# Patient Record
Sex: Male | Born: 1955 | Race: White | Hispanic: No | Marital: Married | Smoking: Former smoker
Health system: Southern US, Community
[De-identification: ages and names within clinical notes are randomized; demographics above are authoritative.]

## PROBLEM LIST (undated history)

## (undated) DIAGNOSIS — R3912 Poor urinary stream: Secondary | ICD-10-CM

## (undated) DIAGNOSIS — K603 Anal fistula, unspecified: Secondary | ICD-10-CM

## (undated) DIAGNOSIS — G4730 Sleep apnea, unspecified: Secondary | ICD-10-CM

## (undated) DIAGNOSIS — M109 Gout, unspecified: Secondary | ICD-10-CM

## (undated) DIAGNOSIS — E785 Hyperlipidemia, unspecified: Secondary | ICD-10-CM

## (undated) DIAGNOSIS — K219 Gastro-esophageal reflux disease without esophagitis: Secondary | ICD-10-CM

## (undated) DIAGNOSIS — M1990 Unspecified osteoarthritis, unspecified site: Secondary | ICD-10-CM

## (undated) DIAGNOSIS — Z973 Presence of spectacles and contact lenses: Secondary | ICD-10-CM

## (undated) DIAGNOSIS — N401 Enlarged prostate with lower urinary tract symptoms: Secondary | ICD-10-CM

## (undated) DIAGNOSIS — R972 Elevated prostate specific antigen [PSA]: Secondary | ICD-10-CM

## (undated) DIAGNOSIS — Z9189 Other specified personal risk factors, not elsewhere classified: Secondary | ICD-10-CM

## (undated) HISTORY — PX: PROSTATE BIOPSY: SHX241

---

## 1998-04-13 ENCOUNTER — Ambulatory Visit (HOSPITAL_COMMUNITY): Admission: RE | Admit: 1998-04-13 | Discharge: 1998-04-13 | Payer: Self-pay | Admitting: Orthopedic Surgery

## 1998-05-07 ENCOUNTER — Emergency Department (HOSPITAL_COMMUNITY): Admission: EM | Admit: 1998-05-07 | Discharge: 1998-05-07 | Payer: Self-pay | Admitting: Emergency Medicine

## 2002-02-22 ENCOUNTER — Emergency Department (HOSPITAL_COMMUNITY): Admission: EM | Admit: 2002-02-22 | Discharge: 2002-02-22 | Payer: Self-pay | Admitting: Emergency Medicine

## 2003-08-08 HISTORY — PX: NASAL SINUS SURGERY: SHX719

## 2013-08-27 ENCOUNTER — Ambulatory Visit (INDEPENDENT_AMBULATORY_CARE_PROVIDER_SITE_OTHER): Payer: BC Managed Care – PPO | Admitting: General Surgery

## 2013-08-27 ENCOUNTER — Encounter (INDEPENDENT_AMBULATORY_CARE_PROVIDER_SITE_OTHER): Payer: Self-pay | Admitting: General Surgery

## 2013-08-27 ENCOUNTER — Encounter (INDEPENDENT_AMBULATORY_CARE_PROVIDER_SITE_OTHER): Payer: Self-pay

## 2013-08-27 VITALS — BP 136/90 | HR 75 | Temp 98.0°F | Resp 18 | Ht 73.0 in | Wt 250.0 lb

## 2013-08-27 DIAGNOSIS — K612 Anorectal abscess: Secondary | ICD-10-CM

## 2013-08-27 DIAGNOSIS — K610 Anal abscess: Secondary | ICD-10-CM

## 2013-08-27 MED ORDER — CIPROFLOXACIN HCL 500 MG PO TABS: 500.0000 mg | ORAL_TABLET | Freq: Two times a day (BID) | ORAL | Status: AC

## 2013-08-27 MED ORDER — HYDROCODONE-ACETAMINOPHEN 5-325 MG PO TABS: 1.0000 | ORAL_TABLET | ORAL | Status: DC | PRN

## 2013-08-27 NOTE — Patient Instructions (Signed)
Perianal Abscess  An abscess is an infected area that contains a collection of pus and debris. A perianal abscess is one that occurs in the perineal area, which is the area between the anus and the scrotum in males and between the anus and the vagina in females. Perianal abscesses can vary in size. Without treatment, a perianal abscess can become larger and cause other problems.  CAUSES   Glands in the perineal area can become plugged up with debris. When this happens, an abscess may form.   SIGNS AND SYMPTOMS   The most common symptoms of a perianal abscess are:  · Swelling and redness in the area of the abscess. The redness may go beyond the abscess and appear as a red streak on the skin.  · Pain in the area of the abscess.  Other possible symptoms include:   · A visible lump or a lump that can be felt when touching the area and is usually painful.  · Bleeding or pus-like discharge from the area.  · Fever.  · General weakness.  DIAGNOSIS   Your health care provider will take a medical history and examine the area. This may involve examining the rectal area with a gloved hand (digital rectal exam). For women, it may require a careful vaginal exam. Sometimes, the health care provider needs to look into the rectum using a probe or scope.  TREATMENT   Treatment often requires making a cut (incision) in the abscess to drain the pus. This can sometimes be done in your health care provider's office or an emergency department after giving you medicine to numb the area (local anesthetic). For larger or deeper abscesses, surgery may be required to drain the abscess. Antibiotic medicines are sometimes given if there is infection of the surrounding tissue (cellulitis). In some cases, gauze is packed into the abscess to continue draining the area. Frequent sitz baths may be recommended to help the wound heal and to reduce the chance of the abscess coming back.  HOME CARE INSTRUCTIONS   · Only take over-the-counter or  prescription medicines for pain, fever, or discomfort as directed by your health care provider.  · Take antibiotic medicine as directed. Make sure you finish it even if you start to feel better.  · If gauze is used in the abscess, follow your health care provider's instructions for removing or changing the gauze. It can usually be removed in 2 3 days.  · If one or more drains have been placed in the abscess cavity, be careful not to pull at them. Your health care provider will tell you how long they need to remain in place.  · Take warm sitz baths 3 4 times a day and after bowel movements. This will help reduce pain and swelling.  · Keep the skin around the abscess clean and dry. Avoid cleaning the area too much.  · Avoid scratching the abscess area.  · Avoid using colored or perfumed toilet papers.  SEEK MEDICAL CARE IF:   · You have trouble having a bowel movement or passing urine.  · Your pain or swelling in the affected area does not seem to be improving.  · The gauze packing or the drains come out before the planned time.  SEEK IMMEDIATE MEDICAL CARE IF:   · You have problems moving or using your legs.  · You have severe or increasing pain.  · Your swelling in the affected area suddenly gets worse.  · You have a large   increase in bleeding or passing of pus.  · You have chills or a fever.  MAKE SURE YOU:   · Understand these instructions.  · Will watch your condition.  · Will get help right away if you are not doing well or get worse.  Document Released: 08/30/2006 Document Revised: 05/14/2013 Document Reviewed: 03/05/2013  Exit[employer]® Patient Information ©2014 [employer].

## 2013-08-27 NOTE — Progress Notes (Signed)
Chief complaint: Perirectal lump and pain  History: Patient is a pleasant 58 year old male referred by Dr. Clelia CroftShaw. He states that about 2 weeks ago he began to gradually develop some tenderness and swelling to the left of his anus. This gradually worsened and this past weekend, about 5 days ago, it was particularly bad. He states that over the past 5 days it has been no better or no worse. No fever or chills. No drainage. No previous history of any perirectal disease or inflammatory bowel disease. He saw Dr. Clelia CroftShaw today and was referred for a possible perirectal abscess.  No past medical history on file. Past Surgical History  Procedure Laterality Date  . Nasal sinus surgery  2009   Current Outpatient Prescriptions  Medication Sig Dispense Refill  . atorvastatin (LIPITOR) 20 MG tablet Take 20 mg by mouth daily.       No current facility-administered medications for this visit.   Exam: BP 136/90  Pulse 75  Temp(Src) 98 F (36.7 C)  Resp 18  Ht 6\' 1"  (1.854 m)  Wt 250 lb (113.399 kg)  BMI 32.99 kg/m2 General: Overweight but healthy-appearing Caucasian male in no distress Current exam today was limited and a rectal exam. About 4 cm from the anal verge in the left lateral perirectal space is a approximately a 3 cm area of swelling and induration but I do not feel any definite fluctuance and there is no overlying erythema. I cannot detect any obvious communication back toward the anus.  After explaining the procedure I anesthetized the overlying skin and surrounding soft tissue with local anesthesia. Using an 18-gauge needle I was able to aspirate definite pus. I then incised into a moderate-sized somewhat deep perianal abscess which was opened and packed with iodoform gauze.  He is given a prescription for Augmentin for 1 week. Also Vicodin for pain. He given literature regarding rectal abscesses and fistulas and I asked them to call if he is not feeling steadily better. Return in 3 weeks.

## 2013-09-18 ENCOUNTER — Ambulatory Visit (INDEPENDENT_AMBULATORY_CARE_PROVIDER_SITE_OTHER): Payer: BC Managed Care – PPO | Admitting: General Surgery

## 2013-09-18 ENCOUNTER — Encounter (INDEPENDENT_AMBULATORY_CARE_PROVIDER_SITE_OTHER): Payer: Self-pay | Admitting: General Surgery

## 2013-09-18 VITALS — BP 130/86 | HR 72 | Temp 97.5°F | Resp 15 | Ht 73.0 in | Wt 253.6 lb

## 2013-09-18 DIAGNOSIS — Z09 Encounter for follow-up examination after completed treatment for conditions other than malignant neoplasm: Secondary | ICD-10-CM

## 2013-09-18 NOTE — Progress Notes (Signed)
History: Patient returns 3 weeks following I&D of the perianal abscess. This was in the left lateral space. He reports he has been feeling steadily better ever since and has just mild tenderness and no drainage  Exam: BP 130/86  Pulse 72  Temp(Src) 97.5 F (36.4 C) (Oral)  Resp 15  Ht 6\' 1"  (1.854 m)  Wt 253 lb 9.6 oz (115.032 kg)  BMI 33.47 kg/m2 The incision is completely healed. No skin openings. There is a small area of induration just beneath the I&D site but no inflammation or fluctuance.  Assessment plan: Appears to be healing well following I&D of perianal abscess. I do not see evidence of fistula formation at this time. I asked him to call as needed should he develop any worsening symptoms.

## 2013-09-19 ENCOUNTER — Telehealth (INDEPENDENT_AMBULATORY_CARE_PROVIDER_SITE_OTHER): Payer: Self-pay | Admitting: General Surgery

## 2013-09-19 NOTE — Telephone Encounter (Signed)
Patient called in explaining that he believes his wound has opened back up.  After speaking with Dr. Derrell LollingIngram, he wanted the patient to take warm bath soaks TID, wear a pad, and get an appointment with Dr. Johna SheriffHoxworth in 2 weeks to reck the wound.  I explained this information and informed him that Dr. Jamse MeadHoxworth's nurse will call him with an appointment.  Informed him to call us if things seem to progress to something worse.  He verbalized understanding.

## 2013-10-20 ENCOUNTER — Encounter (INDEPENDENT_AMBULATORY_CARE_PROVIDER_SITE_OTHER): Payer: Self-pay

## 2013-10-20 ENCOUNTER — Encounter (INDEPENDENT_AMBULATORY_CARE_PROVIDER_SITE_OTHER): Payer: Self-pay | Admitting: General Surgery

## 2013-10-20 ENCOUNTER — Ambulatory Visit (INDEPENDENT_AMBULATORY_CARE_PROVIDER_SITE_OTHER): Payer: BC Managed Care – PPO | Admitting: General Surgery

## 2013-10-20 VITALS — BP 124/82 | HR 76 | Temp 99.1°F | Resp 16 | Ht 73.0 in | Wt 257.0 lb

## 2013-10-20 DIAGNOSIS — K610 Anal abscess: Secondary | ICD-10-CM

## 2013-10-20 DIAGNOSIS — K612 Anorectal abscess: Secondary | ICD-10-CM

## 2013-10-20 MED ORDER — HYDROCODONE-ACETAMINOPHEN 5-325 MG PO TABS: 1.0000 | ORAL_TABLET | ORAL | Status: DC | PRN

## 2013-10-20 MED ORDER — CIPROFLOXACIN HCL 500 MG PO TABS: 500.0000 mg | ORAL_TABLET | Freq: Two times a day (BID) | ORAL | Status: AC

## 2013-10-20 NOTE — Patient Instructions (Signed)
Remove packing tomorrow. Soak in a warm tub or shower twice daily and cover with dry gauze. Call if symptoms are not steadily improving.

## 2013-10-20 NOTE — Progress Notes (Signed)
Chief complaint: Recurrent rectal abscess  History: Patient returned to the office. I drained a perirectal or deep perianal abscess at the end of January. He had had no previous history. I saw him back in early February feeling well and I did not see any evidence of fistula formation at that time. He continued to do well until for 5 days ago and has developed recurrent pain and swelling at the same site and now some purulent drainage.  Exam: BP 124/82  Pulse 76  Temp(Src) 99.1 F (37.3 C) (Temporal)  Resp 16  Ht 6\' 1"  (1.854 m)  Wt 257 lb (116.574 kg)  BMI 33.91 kg/m2 In the left perianal space at the site of the previous incision as a pinpoint area of purulent drainage and some mild induration and erythema.  Assessment and plan: Recurrent perianal abscess. After explaining the procedure and local anesthesia I excised the previous scar and opened up a recurrent abscess cavity is about a centimeter deep to the skin and probably a centimeter in diameter. This was packed with iodoform gauze. I cannot feel any definite fistula tract up toward the anus. An unrecognized fistula and I discussed this with him. He was given a prescription for Cipro and Norco and wound care instructions. He will return in 3 weeks or call sooner if he is not feeling steadily better

## 2013-11-06 ENCOUNTER — Encounter (INDEPENDENT_AMBULATORY_CARE_PROVIDER_SITE_OTHER): Payer: Self-pay | Admitting: Surgery

## 2013-11-06 ENCOUNTER — Encounter (INDEPENDENT_AMBULATORY_CARE_PROVIDER_SITE_OTHER): Payer: Self-pay

## 2013-11-06 ENCOUNTER — Ambulatory Visit (INDEPENDENT_AMBULATORY_CARE_PROVIDER_SITE_OTHER): Payer: BC Managed Care – PPO | Admitting: Surgery

## 2013-11-06 VITALS — BP 132/80 | HR 78 | Temp 97.6°F | Resp 16 | Ht 73.0 in | Wt 251.4 lb

## 2013-11-06 DIAGNOSIS — K612 Anorectal abscess: Secondary | ICD-10-CM

## 2013-11-06 DIAGNOSIS — K610 Anal abscess: Secondary | ICD-10-CM

## 2013-11-06 MED ORDER — OXYCODONE-ACETAMINOPHEN 5-325 MG PO TABS: 1.0000 | ORAL_TABLET | ORAL | Status: DC | PRN

## 2013-11-06 MED ORDER — LEVOFLOXACIN 500 MG PO TABS: 500.0000 mg | ORAL_TABLET | Freq: Every day | ORAL | Status: AC

## 2013-11-06 NOTE — Progress Notes (Signed)
Subjective:     Patient ID: Michael Randall, male   DOB: [birth year], 58 y.o.   MRN: [id number]  HPI This is a patient of Dr. Johna SheriffHoxworth.  He has had several incision and drainage procedures of a perianal abscess. This has been concerning for a possible fistula. His last procedure was on March 16. He started having pain and drainage yesterday.  Review of Systems     Objective:   Physical Exam On exam, there is a small open area draining serous fluid. I prepped it with Betadine, anesthetized with lidocaine, and opened up the previous I and D. Site. There was no purulence.    Assessment:     Recurrent perianal abscess     Plan:     Again, this is worrisome for a developing fistula. I will place him on Levaquin and for Percocet.  He will followup with Dr. Johna SheriffHoxworth next week

## 2013-11-12 ENCOUNTER — Encounter (INDEPENDENT_AMBULATORY_CARE_PROVIDER_SITE_OTHER): Payer: Self-pay | Admitting: General Surgery

## 2013-11-12 ENCOUNTER — Ambulatory Visit (INDEPENDENT_AMBULATORY_CARE_PROVIDER_SITE_OTHER): Payer: BC Managed Care – PPO | Admitting: General Surgery

## 2013-11-12 VITALS — BP 130/80 | HR 80 | Temp 97.0°F | Resp 16 | Wt 252.6 lb

## 2013-11-12 DIAGNOSIS — K603 Anal fistula, unspecified: Secondary | ICD-10-CM

## 2013-11-12 NOTE — Progress Notes (Signed)
Chief complaint: Followup perianal abscess  History: Patient returns to the office for followup. He has had 2 previous pararectal abscesses drained which appeared to heal completely. He however presented with a recurrent abscess in the office recently with Dr. Rayburn MaBlackmon open and drained a small amount of material from what appeared to be a fistula. He still has some discomfort but is gradually feeling better.  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 There is no active infection currently. We discussed the nature of the problem and the potential surgical options. I will ask to Dr. Maisie Fushomas in our office to evaluate him from a colorectal perspective.

## 2013-11-12 NOTE — Patient Instructions (Signed)
Anal Fistula °An anal fistula is an abnormal tunnel that develops between the bowel and skin near the outside of the anus, where feces comes out. The anus has a number of tiny glands that make lubricating fluid. Sometimes these glands can become plugged and infected. This may lead to the development of a fluid-filled pocket (abscess). An anal fistula often develops after this infection or abscess. It is nearly always caused by a past or current anal abscess.  °CAUSES  °Though an anal fistula is almost always caused by a past or current anal abscess, other causes can include: °· A complication of surgery. °· Trauma to the rectal area. °· Radiation to the area. °· Other medical conditions or diseases, such as:   °· Chronic inflammatory bowel disease, such as Crohn disease or ulcerative colitis.   °· Colon or rectal cancer.   °· Diverticular disease, such as diverticulitis.   °· A sexually transmitted disease, such as gonorrhea, chlamydia, or syphilis. °· An HIV infection or AIDS.   °SYMPTOMS  °· Throbbing or constant pain that may be worse when sitting.   °· Swelling or irritation around the anus.   °· Drainage of pus or blood from an opening near the anus.   °· Pain with bowel movements. °· Fever or chills. °DIAGNOSIS  °Your caregiver will examine the area to find the openings of the anal fistula and the fistula tract. The external opening of the anal fistula may be seen during a physical examination. Other examinations that may be performed include:  °· Examination of the rectal area with a gloved hand (digital rectal exam).   °· Examination with a probe or scope to help locate the internal opening of the fistula.   °· Injection of a dye into the fistula opening. X-rays can be taken to find the exact location and path of the fistula.   °· An MRI or ultrasound of the anal area.   °Other tests may be performed to find the cause of the anal fistula.    °TREATMENT  °The most common treatment for an anal fistula is  surgery. There are different surgery options depending on where your fistula is located and how complex the fistula is. Surgical options include: °· A fistulotomy. This surgery involves opening up the whole fistula and draining the contents inside to promote healing. °· Seton placement. A silk string (seton) is placed into the fistula during a fistulotomy to drain any infection to promote healing. °· Advancement flap procedure. Tissue is removed from your rectum or the skin around the anus and is attached to the opening of the fistula. °· Bioprosthetic plug. A cone-shaped plug is made from your tissue and is used to block the opening of the fistula. °Some anal fistulas do not require surgery. A fibrin glue is a non-surgical option that involves injecting the glue to seal the fistula. You also may be prescribed an antibiotic medicine to treat an infection.  °HOME CARE INSTRUCTIONS  °· Take your antibiotics as directed. Finish them even if you start to feel better. °· Only take over-the-counter or prescription medicines as directed by your caregiver. Use a stool softener or laxative, if recommended.   °· Eat a high-fiber diet to help avoid constipation or as directed by your caregiver. °· Drink enough water to keep your urine clear or pale yellow.   °· A warm sitz bath may be soothing and help with healing. You may take warm sitz baths for 15 20 minutes, 3 4 times a day to ease pain and discomfort.   °· Follow excellent hygiene to keep the   anal area as clean and dry as possible. Use wet toilet paper or moist towelettes after each bowel movement.   °SEEK MEDICAL CARE IF: °You have increased pain not controlled with medicines.  °SEEK IMMEDIATE MEDICAL CARE IF: °· You have severe, intolerable pain. °· You have new swelling, redness, or discharge around the anal area. °· You have tenderness or warmth around the anal area. °· You have chills or diarrhea. °· You have severe problems urinating or having a bowel movement.    °· You have a fever or persistent symptoms for more than 2 3 days.   °· You have a fever and your symptoms suddenly get worse.   °MAKE SURE YOU:  °· Understand these instructions. °· Will watch your condition. °· Will get help right away if you are not doing well or get worse. °Document Released: 07/06/2008 Document Revised: 07/10/2012 Document Reviewed: 05/29/2011 °[employer]® Patient Information ©2014 [employer]. ° °

## 2013-11-14 ENCOUNTER — Encounter (INDEPENDENT_AMBULATORY_CARE_PROVIDER_SITE_OTHER): Payer: BC Managed Care – PPO | Admitting: General Surgery

## 2013-11-27 ENCOUNTER — Ambulatory Visit (INDEPENDENT_AMBULATORY_CARE_PROVIDER_SITE_OTHER): Payer: BC Managed Care – PPO | Admitting: General Surgery

## 2013-12-02 ENCOUNTER — Ambulatory Visit (INDEPENDENT_AMBULATORY_CARE_PROVIDER_SITE_OTHER): Payer: BC Managed Care – PPO | Admitting: General Surgery

## 2013-12-02 ENCOUNTER — Encounter (INDEPENDENT_AMBULATORY_CARE_PROVIDER_SITE_OTHER): Payer: Self-pay | Admitting: General Surgery

## 2013-12-02 VITALS — BP 130/88 | HR 78 | Temp 97.2°F | Resp 14 | Wt 254.0 lb

## 2013-12-02 DIAGNOSIS — K603 Anal fistula: Secondary | ICD-10-CM

## 2013-12-02 NOTE — Progress Notes (Signed)
Chief Complaint  Patient presents with  . New Evaluation    peri-anal fistula    HISTORY: Michael Randall is a 58 y.o. male who presents to the office with recurrent perianal abscesses.  This had been occurring since Jan. His bowel habits are regular and his bowel movements are soft.  He denies constipation or diarrhea.  His fiber intake is dietary.  His last colonoscopy was about 4 yrs ago and was normal per pt.       History reviewed. No pertinent past medical history.    Past Surgical History  Procedure Laterality Date  . Nasal sinus surgery  2009        Current Outpatient Prescriptions  Medication Sig Dispense Refill  . atorvastatin (LIPITOR) 20 MG tablet Take 20 mg by mouth daily.      Marland Kitchen. oxyCODONE-acetaminophen (PERCOCET/ROXICET) 5-325 MG per tablet       . sertraline (ZOLOFT) 25 MG tablet        No current facility-administered medications for this visit.      Allergies  Allergen Reactions  . Augmentin [Amoxicillin-Pot Clavulanate] Hives      Family History  Problem Relation Age of Onset  . Cancer Mother      breast  . Heart disease Father   . Cancer Sister     breast  . Cancer Maternal Grandmother     breast ca    History   Social History  . Marital Status: Married    Spouse Name: N/A    Number of Children: N/A  . Years of Education: N/A   Social History Main Topics  . Smoking status: Former Smoker    Quit date: 09/18/2006  . Smokeless tobacco: Never Used     Comment: quit cigars 7 yrs ago  . Alcohol Use: Yes     Comment: occasionall  . Drug Use: No  . Sexual Activity: None   Other Topics Concern  . None   Social History Narrative  . None      REVIEW OF SYSTEMS - PERTINENT POSITIVES ONLY: Review of Systems - General ROS: negative for - chills, fever or weight loss Hematological and Lymphatic ROS: negative for - bleeding problems, blood clots or bruising Respiratory ROS: no cough, shortness of breath, or wheezing Cardiovascular ROS: no  chest pain or dyspnea on exertion Gastrointestinal ROS: negative Genito-Urinary ROS: no dysuria, trouble voiding, or hematuria  EXAM: Filed Vitals:   12/02/13 1155  BP: 130/88  Pulse: 78  Temp: 97.2 F (36.2 C)  Resp: 14    General appearance: alert and cooperative Resp: clear to auscultation bilaterally CV: RRR GI: soft, nontender  Anal Exam Findings: moderate sphincter tone, long sphincter, L lat ext opening with min drainage, no fluctuant masses    ASSESSMENT AND PLAN: Michael Randall is a 58 y.o. 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  I have recommended anal exam under anesthesia. If we do find a fistula we will do a fistulotomy or a seton depending on the depth of the fistula. We discussed the risk and benefits of both of these. We discussed that there is a small risk of incontinence with a fistulotomy. We discussed that if a seton is placed he would need additional surgery to correct the fistula in the future. He is agreeable to this and we will schedule this at his convenience.    Vanita PandaAlicia C Brenlynn Fake, MD Colon and Rectal Surgery / General Surgery [facility]  Visit Diagnoses: No diagnosis found.  Primary Care Physician: Lupita RaiderSHAW,KIMBERLEE, MD

## 2013-12-02 NOTE — Patient Instructions (Signed)

## 2013-12-12 ENCOUNTER — Encounter (INDEPENDENT_AMBULATORY_CARE_PROVIDER_SITE_OTHER): Payer: BC Managed Care – PPO | Admitting: General Surgery

## 2013-12-15 ENCOUNTER — Encounter (HOSPITAL_BASED_OUTPATIENT_CLINIC_OR_DEPARTMENT_OTHER): Payer: Self-pay | Admitting: *Deleted

## 2013-12-16 ENCOUNTER — Encounter (HOSPITAL_BASED_OUTPATIENT_CLINIC_OR_DEPARTMENT_OTHER): Payer: Self-pay | Admitting: *Deleted

## 2013-12-16 NOTE — Progress Notes (Signed)
NPO AFTER MN.  ARRIVE AT 1015.  NEEDS HG. 

## 2013-12-16 NOTE — Progress Notes (Signed)
12/16/13 1325  OBSTRUCTIVE SLEEP APNEA  Have you ever been diagnosed with sleep apnea through a sleep study? No  Do you snore loudly (loud enough to be heard through closed doors)?  1  Do you often feel tired, fatigued, or sleepy during the daytime? 0  Has anyone observed you stop breathing during your sleep? 1  Do you have, or are you being treated for high blood pressure? 0  BMI more than 35 kg/m2? 0  Age over 56 years old? 1  Neck circumference greater than 40 cm/16 inches? 1  Gender: 1  Obstructive Sleep Apnea Score 5  Score 4 or greater  Results sent to PCP

## 2013-12-19 ENCOUNTER — Ambulatory Visit (HOSPITAL_BASED_OUTPATIENT_CLINIC_OR_DEPARTMENT_OTHER)
Admission: RE | Admit: 2013-12-19 | Discharge: 2013-12-19 | Disposition: A | Discharge status: Home / Self Care | Payer: BC Managed Care – PPO | Source: Ambulatory Visit | Attending: General Surgery | Admitting: General Surgery

## 2013-12-19 ENCOUNTER — Encounter (HOSPITAL_BASED_OUTPATIENT_CLINIC_OR_DEPARTMENT_OTHER)
Admission: RE | Disposition: A | Discharge status: Home / Self Care | Payer: Self-pay | Source: Ambulatory Visit | Attending: General Surgery

## 2013-12-19 ENCOUNTER — Encounter (HOSPITAL_BASED_OUTPATIENT_CLINIC_OR_DEPARTMENT_OTHER): Payer: Self-pay

## 2013-12-19 ENCOUNTER — Ambulatory Visit (HOSPITAL_BASED_OUTPATIENT_CLINIC_OR_DEPARTMENT_OTHER): Payer: BC Managed Care – PPO | Admitting: Anesthesiology

## 2013-12-19 ENCOUNTER — Encounter (HOSPITAL_BASED_OUTPATIENT_CLINIC_OR_DEPARTMENT_OTHER): Payer: BC Managed Care – PPO | Admitting: Anesthesiology

## 2013-12-19 DIAGNOSIS — K603 Anal fistula, unspecified: Secondary | ICD-10-CM | POA: Insufficient documentation

## 2013-12-19 DIAGNOSIS — K219 Gastro-esophageal reflux disease without esophagitis: Secondary | ICD-10-CM | POA: Insufficient documentation

## 2013-12-19 DIAGNOSIS — Z87891 Personal history of nicotine dependence: Secondary | ICD-10-CM | POA: Insufficient documentation

## 2013-12-19 HISTORY — PX: PLACEMENT OF SETON: SHX6029

## 2013-12-19 HISTORY — DX: Presence of spectacles and contact lenses: Z97.3

## 2013-12-19 HISTORY — PX: EVALUATION UNDER ANESTHESIA WITH ANAL FISTULECTOMY: SHX5621

## 2013-12-19 HISTORY — DX: Gastro-esophageal reflux disease without esophagitis: K21.9

## 2013-12-19 HISTORY — DX: Hyperlipidemia, unspecified: E78.5

## 2013-12-19 HISTORY — DX: Other specified personal risk factors, not elsewhere classified: Z91.89

## 2013-12-19 HISTORY — DX: Unspecified osteoarthritis, unspecified site: M19.90

## 2013-12-19 HISTORY — DX: Anal fistula: K60.3

## 2013-12-19 HISTORY — DX: Anal fistula, unspecified: K60.30

## 2013-12-19 LAB — POCT HEMOGLOBIN-HEMACUE: Hemoglobin: 15.4 g/dL (ref 13.0–17.0)

## 2013-12-19 SURGERY — EXAM UNDER ANESTHESIA WITH ANAL FISTULECTOMY
Anesthesia: Monitor Anesthesia Care | Site: Rectum

## 2013-12-19 MED ORDER — HYDROGEN PEROXIDE 3 % EX SOLN
CUTANEOUS | Status: DC | PRN
  Administered 2013-12-19: 1

## 2013-12-19 MED ORDER — DEXAMETHASONE SODIUM PHOSPHATE 4 MG/ML IJ SOLN
INTRAMUSCULAR | Status: DC | PRN
  Administered 2013-12-19: 10 mg via INTRAVENOUS

## 2013-12-19 MED ORDER — SODIUM CHLORIDE 0.9 % IJ SOLN
3.0000 mL | INTRAMUSCULAR | Status: DC | PRN
  Filled 2013-12-19: qty 3

## 2013-12-19 MED ORDER — OXYCODONE HCL 5 MG PO TABS
5.0000 mg | ORAL_TABLET | ORAL | Status: DC | PRN
  Filled 2013-12-19: qty 2

## 2013-12-19 MED ORDER — SODIUM CHLORIDE 0.9 % IJ SOLN
3.0000 mL | Freq: Two times a day (BID) | INTRAMUSCULAR | Status: DC
  Filled 2013-12-19: qty 3

## 2013-12-19 MED ORDER — FENTANYL CITRATE 0.05 MG/ML IJ SOLN
INTRAMUSCULAR | Status: AC
  Filled 2013-12-19: qty 4

## 2013-12-19 MED ORDER — PROPOFOL 10 MG/ML IV BOLUS
INTRAVENOUS | Status: DC | PRN
  Administered 2013-12-19: 30 mg via INTRAVENOUS
  Administered 2013-12-19: 50 mg via INTRAVENOUS

## 2013-12-19 MED ORDER — KETAMINE HCL 10 MG/ML IJ SOLN
INTRAMUSCULAR | Status: AC
  Filled 2013-12-19: qty 1

## 2013-12-19 MED ORDER — LACTATED RINGERS IV SOLN
INTRAVENOUS | Status: DC
  Administered 2013-12-19: 10:00:00 via INTRAVENOUS
  Filled 2013-12-19: qty 1000

## 2013-12-19 MED ORDER — FENTANYL CITRATE 0.05 MG/ML IJ SOLN
INTRAMUSCULAR | Status: DC | PRN
  Administered 2013-12-19 (×3): 50 ug via INTRAVENOUS

## 2013-12-19 MED ORDER — MIDAZOLAM HCL 5 MG/5ML IJ SOLN
INTRAMUSCULAR | Status: DC | PRN
  Administered 2013-12-19: 2 mg via INTRAVENOUS

## 2013-12-19 MED ORDER — LACTATED RINGERS IV SOLN
INTRAVENOUS | Status: DC
  Filled 2013-12-19: qty 1000

## 2013-12-19 MED ORDER — LIDOCAINE 5 % EX OINT
TOPICAL_OINTMENT | CUTANEOUS | Status: DC | PRN
  Administered 2013-12-19: 1

## 2013-12-19 MED ORDER — ACETAMINOPHEN 650 MG RE SUPP
650.0000 mg | RECTAL | Status: DC | PRN
  Filled 2013-12-19: qty 1

## 2013-12-19 MED ORDER — SODIUM CHLORIDE 0.9 % IV SOLN
250.0000 mL | INTRAVENOUS | Status: DC | PRN
  Filled 2013-12-19: qty 250

## 2013-12-19 MED ORDER — ONDANSETRON HCL 4 MG/2ML IJ SOLN
INTRAMUSCULAR | Status: DC | PRN
  Administered 2013-12-19: 4 mg via INTRAVENOUS

## 2013-12-19 MED ORDER — FENTANYL CITRATE 0.05 MG/ML IJ SOLN
25.0000 ug | INTRAMUSCULAR | Status: DC | PRN
  Filled 2013-12-19: qty 1

## 2013-12-19 MED ORDER — PROPOFOL 10 MG/ML IV EMUL
INTRAVENOUS | Status: DC | PRN
  Administered 2013-12-19: 50 ug/kg/min via INTRAVENOUS

## 2013-12-19 MED ORDER — MIDAZOLAM HCL 2 MG/2ML IJ SOLN
INTRAMUSCULAR | Status: AC
  Filled 2013-12-19: qty 2

## 2013-12-19 MED ORDER — DOCUSATE SODIUM 100 MG PO CAPS: 100.0000 mg | ORAL_CAPSULE | Freq: Two times a day (BID) | ORAL | Status: DC

## 2013-12-19 MED ORDER — BUPIVACAINE-EPINEPHRINE 0.5% -1:200000 IJ SOLN
INTRAMUSCULAR | Status: DC | PRN
  Administered 2013-12-19: 30 mL

## 2013-12-19 MED ORDER — ACETAMINOPHEN 325 MG PO TABS
650.0000 mg | ORAL_TABLET | ORAL | Status: DC | PRN
  Filled 2013-12-19: qty 2

## 2013-12-19 MED ORDER — KETOROLAC TROMETHAMINE 30 MG/ML IJ SOLN
INTRAMUSCULAR | Status: DC | PRN
  Administered 2013-12-19: 30 mg via INTRAVENOUS

## 2013-12-19 MED ORDER — OXYCODONE HCL 5 MG PO TABS: 5.0000 mg | ORAL_TABLET | ORAL | Status: DC | PRN

## 2013-12-19 MED ORDER — KETAMINE HCL 10 MG/ML IJ SOLN
INTRAMUSCULAR | Status: DC | PRN
  Administered 2013-12-19: 50 mg via INTRAVENOUS

## 2013-12-19 MED ORDER — OXYCODONE HCL 5 MG PO TABS
ORAL_TABLET | ORAL | Status: AC
  Filled 2013-12-19: qty 1

## 2013-12-19 MED ORDER — OXYCODONE HCL 5 MG PO TABS
5.0000 mg | ORAL_TABLET | ORAL | Status: DC | PRN
  Administered 2013-12-19: 5 mg via ORAL
  Filled 2013-12-19: qty 1

## 2013-12-19 SURGICAL SUPPLY — 44 items
BANDAGE GAUZE ELAST BULKY 4 IN (GAUZE/BANDAGES/DRESSINGS) ×2 IMPLANT
BENZOIN TINCTURE PRP APPL 2/3 (GAUZE/BANDAGES/DRESSINGS) ×2 IMPLANT
BLADE HEX COATED 2.75 (ELECTRODE) ×2 IMPLANT
BLADE SURG 15 STRL LF DISP TIS (BLADE) ×1 IMPLANT
BLADE SURG 15 STRL SS (BLADE) ×1
BRIEF STRETCH FOR OB PAD LRG (UNDERPADS AND DIAPERS) ×2 IMPLANT
CANISTER SUCTION 2500CC (MISCELLANEOUS) ×2 IMPLANT
CLOTH BEACON ORANGE TIMEOUT ST (SAFETY) ×2 IMPLANT
COVER MAYO STAND STRL (DRAPES) ×2 IMPLANT
COVER TABLE BACK 60X90 (DRAPES) ×2 IMPLANT
DECANTER SPIKE VIAL GLASS SM (MISCELLANEOUS) ×2 IMPLANT
DRAPE LG THREE QUARTER DISP (DRAPES) ×4 IMPLANT
DRAPE PED LAPAROTOMY (DRAPES) ×2 IMPLANT
ELECT REM PT RETURN 9FT ADLT (ELECTROSURGICAL) ×2
ELECTRODE REM PT RTRN 9FT ADLT (ELECTROSURGICAL) ×1 IMPLANT
GLOVE BIO SURGEON STRL SZ 6.5 (GLOVE) ×6 IMPLANT
GLOVE INDICATOR 6.5 STRL GRN (GLOVE) ×2 IMPLANT
GLOVE INDICATOR 7.0 STRL GRN (GLOVE) ×4 IMPLANT
GOWN STRL REIN 3XL LVL4 (GOWN DISPOSABLE) ×2 IMPLANT
GOWN STRL REUS W/ TWL LRG LVL3 (GOWN DISPOSABLE) ×1 IMPLANT
GOWN STRL REUS W/TWL 2XL LVL3 (GOWN DISPOSABLE) ×2 IMPLANT
GOWN STRL REUS W/TWL LRG LVL3 (GOWN DISPOSABLE) ×1
HYDROGEN PEROXIDE 16OZ (MISCELLANEOUS) ×2 IMPLANT
LOOP VESSEL MAXI BLUE (MISCELLANEOUS) ×2 IMPLANT
NEEDLE HYPO 25X1 1.5 SAFETY (NEEDLE) ×2 IMPLANT
NS IRRIG 500ML POUR BTL (IV SOLUTION) ×2 IMPLANT
PACK BASIN DAY SURGERY FS (CUSTOM PROCEDURE TRAY) ×2 IMPLANT
PAD ABD 8X10 STRL (GAUZE/BANDAGES/DRESSINGS) IMPLANT
PENCIL BUTTON HOLSTER BLD 10FT (ELECTRODE) ×2 IMPLANT
SPONGE GAUZE 4X4 12PLY (GAUZE/BANDAGES/DRESSINGS) ×2 IMPLANT
SPONGE SURGIFOAM ABS GEL 12-7 (HEMOSTASIS) IMPLANT
SUT CHROMIC 2 0 SH (SUTURE) IMPLANT
SUT CHROMIC 3 0 SH 27 (SUTURE) ×2 IMPLANT
SUT ETHIBOND 0 (SUTURE) ×2 IMPLANT
SUT MON AB 3-0 SH 27 (SUTURE) ×1
SUT MON AB 3-0 SH27 (SUTURE) ×1 IMPLANT
SUT VIC AB 4-0 P-3 18XBRD (SUTURE) IMPLANT
SUT VIC AB 4-0 P3 18 (SUTURE)
SYR CONTROL 10ML LL (SYRINGE) ×4 IMPLANT
TAPE CLOTH SURG 4X10 WHT LF (GAUZE/BANDAGES/DRESSINGS) ×2 IMPLANT
TOWEL OR 17X24 6PK STRL BLUE (TOWEL DISPOSABLE) ×2 IMPLANT
TRAY DSU PREP LF (CUSTOM PROCEDURE TRAY) ×2 IMPLANT
TUBE CONNECTING 12X1/4 (SUCTIONS) ×2 IMPLANT
YANKAUER SUCT BULB TIP NO VENT (SUCTIONS) ×2 IMPLANT

## 2013-12-19 NOTE — Anesthesia Procedure Notes (Signed)
Procedure Name: MAC Date/Time: 12/19/2013 12:40 PM Performed by: Tyrone NineSAUVE, Aprel Egelhoff F Pre-anesthesia Checklist: Patient identified, Timeout performed, Emergency Drugs available, Suction available and Patient being monitored Patient Re-evaluated:Patient Re-evaluated prior to inductionOxygen Delivery Method: Simple face mask Preoxygenation: Pre-oxygenation with 100% oxygen Intubation Type: IV induction Placement Confirmation: positive ETCO2

## 2013-12-19 NOTE — Discharge Instructions (Addendum)
ANORECTAL SURGERY: POST OP INSTRUCTIONS °1. Take your usually prescribed home medications unless otherwise directed. °2. DIET: During the first few hours after surgery sip on some liquids until you are able to urinate.  It is normal to not urinate for several hours after this surgery.  If you feel uncomfortable, please contact the office for instructions.  After you are able to urinate,you may eat, if you feel like it.  Follow a light bland diet the first 24 hours after arrival home, such as soup, liquids, crackers, etc.  Be sure to include lots of fluids daily (6-8 glasses).  Avoid fast food or heavy meals, as your are more likely to get nauseated.  Eat a low fat diet the next few days after surgery.  Limit caffeine intake to 1-2 servings a day. °3. PAIN CONTROL: °a. Pain is best controlled by a usual combination of several different methods TOGETHER: °i. Muscle relaxation °1.  Soak in a warm bath (or Sitz bath) three times a day and after bowel movements.  Continue to do this until all pain is resolved. °ii. Over the counter pain medication °iii. Prescription pain medication °b. Most patients will experience some swelling and discomfort in the anus/rectal area and incisions.  Heat such as warm towels, sitz baths, warm baths, etc to help relax tight/sore spots and speed recovery.  Some people prefer to use ice, especially in the first couple days after surgery, as it may decrease the pain and swelling, or alternate between ice & heat.  Experiment to what works for you.  Swelling and bruising can take several weeks to resolve.  Pain can take even longer to completely resolve. °c. It is helpful to take an over-the-counter pain medication regularly for the first few weeks.  Choose one of the following that works best for you: °i. Naproxen (Aleve, etc)  Two 220mg tabs twice a day °ii. Ibuprofen (Advil, etc) Three 200mg tabs four times a day (every meal & bedtime) °d. A  prescription for pain medication (such as  percocet, oxycodone, hydrocodone, etc) should be given to you upon discharge.  Take your pain medication as prescribed.  °i. If you are having problems/concerns with the prescription medicine (does not control pain, nausea, vomiting, rash, itching, etc), please call us [phone] to see if we need to switch you to a different pain medicine that will work better for you and/or control your side effect better. °ii. If you need a refill on your pain medication, please contact your pharmacy.  They will contact our office to request authorization. Prescriptions will not be filled after 5 pm or on week-ends. °4. KEEP YOUR BOWELS REGULAR and AVOID CONSTIPATION °a. The goal is one to two soft bowel movements a day.  You should at least have a bowel movement every other day. °b. Avoid getting constipated.  Between the surgery and the pain medications, it is common to experience some constipation. This can be very painful after rectal surgery.  Increasing fluid intake and taking a fiber supplement (such as Metamucil, Citrucel, FiberCon, etc) 1-2 times a day regularly will usually help prevent this problem from occurring.  A stool softener like colace is also recommended.  This can be purchased over the counter at your pharmacy.  You can take it up to 3 times a day.  If you do not have a bowel movement after 24 hrs since your surgery, take one does of milk of magnesia.  If you still haven't had a bowel movement 8-12   hours after that dose, take another dose.  If you don't have a bowel movement 48 hrs after surgery, purchase a Fleets enema from the drug store and administer gently per package instructions.  If you still are having trouble with your bowel movements after that, please call the office for further instructions. °c. If you develop diarrhea or have many loose bowel movements, simplify your diet to bland foods & liquids for a few days.  Stop any stool softeners and decrease your fiber supplement.  Switching to mild  anti-diarrheal medications (Kayopectate, Pepto Bismol) can help.  If this worsens or does not improve, please call us. ° °5. Wound Care °a. Remove your bandages before your first bowel movement or 8 hours after surgery.     °b. Remove any wound packing material at this tim,e as well.  You do not need to repack the wound unless instructed otherwise.  Wear an absorbent pad or soft cotton gauze in your underwear to catch any drainage and help keep the area clean. You should change this every 2-3 hours while awake. °c. Keep the area clean and dry.  Bathe / shower every day, especially after bowel movements.  Keep the area clean by showering / bathing over the incision / wound.   It is okay to soak an open wound to help wash it.  Wet wipes or showers / gentle washing after bowel movements is often less traumatic than regular toilet paper. °d. You may have some styrofoam-like soft packing in the rectum which will come out with the first bowel movement.  °e. You will often notice bleeding with bowel movements.  This should slow down by the end of the first week of surgery °f. Expect some drainage.  This should slow down, too, by the end of the first week of surgery.  Wear an absorbent pad or soft cotton gauze in your underwear until the drainage stops. °g. Do Not sit on a rubber or pillow ring.  This can make you symptoms worse.  You may sit on a soft pillow if needed.  °6. ACTIVITIES as tolerated:   °a. You may resume regular (light) daily activities beginning the next day--such as daily self-care, walking, climbing stairs--gradually increasing activities as tolerated.  If you can walk 30 minutes without difficulty, it is safe to try more intense activity such as jogging, treadmill, bicycling, low-impact aerobics, swimming, etc. °b. Save the most intensive and strenuous activity for last such as sit-ups, heavy lifting, contact sports, etc  Refrain from any heavy lifting or straining until you are off narcotics for pain  control.   °c. You may drive when you are no longer taking prescription pain medication, you can comfortably sit for long periods of time, and you can safely maneuver your car and apply brakes. °d. You may have sexual intercourse when it is comfortable.  °7. FOLLOW UP in our office °a. Please call [facility] at [phone] to set up an appointment to see your surgeon in the office for a follow-up appointment approximately 3-4 weeks after your surgery. °b. Make sure that you call for this appointment the day you arrive home to insure a convenient appointment time. °10. IF YOU HAVE DISABILITY OR FAMILY LEAVE FORMS, BRING THEM TO THE OFFICE FOR PROCESSING.  DO NOT GIVE THEM TO YOUR DOCTOR. ° ° ° ° °WHEN TO CALL US [phone]: °1. Poor pain control °2. Reactions / problems with new medications (rash/itching, nausea, etc)  °3. Fever over 101.5 F (38.5   C) °4. Inability to urinate °5. Nausea and/or vomiting °6. Worsening swelling or bruising °7. Continued bleeding from incision. °8. Increased pain, redness, or drainage from the incision ° °The clinic staff is available to answer your questions during regular business hours (8:30am-5pm).  Please don’t hesitate to call and ask to speak to one of our nurses for clinical concerns.   A surgeon from [facility] is always on call at the hospitals °  °If you have a medical emergency, go to the nearest emergency room or call 911. °  ° °[facility] °[address], [address], [place], [place]  [address] ? °MAIN: [phone] ? TOLL FREE: [phone] ? °FAX [phone] °[email or web address] ° ° ° °Post Anesthesia Home Care Instructions ° °Activity: °Get plenty of rest for the remainder of the day. A responsible adult should stay with you for 24 hours following the procedure.  °For the next 24 hours, DO NOT: °-Drive a car °-Operate machinery °-Drink alcoholic beverages °-Take any medication unless instructed by your physician °-Make  any legal decisions or sign important papers. ° °Meals: °Start with liquid foods such as gelatin or soup. Progress to regular foods as tolerated. Avoid greasy, spicy, heavy foods. If nausea and/or vomiting occur, drink only clear liquids until the nausea and/or vomiting subsides. Call your physician if vomiting continues. ° °Special Instructions/Symptoms: °Your throat may feel dry or sore from the anesthesia or the breathing tube placed in your throat during surgery. If this causes discomfort, gargle with warm salt water. The discomfort should disappear within 24 hours. ° °

## 2013-12-19 NOTE — Anesthesia Preprocedure Evaluation (Addendum)
Anesthesia Evaluation  Patient identified by MRN, date of birth, ID band Patient awake    Reviewed: Allergy & Precautions, H&P , NPO status , Patient's Chart, lab work & pertinent test results  Airway Mallampati: II TM Distance: >3 FB Neck ROM: full    Dental no notable dental hx. (+) Teeth Intact, Dental Advisory Given   Pulmonary former smoker,  Stop bang 5 breath sounds clear to auscultation  Pulmonary exam normal       Cardiovascular Exercise Tolerance: Good negative cardio ROS  Rhythm:regular Rate:Normal     Neuro/Psych negative neurological ROS  negative psych ROS   GI/Hepatic negative GI ROS, Neg liver ROS, GERD-  Medicated and Controlled,  Endo/Other  negative endocrine ROS  Renal/GU negative Renal ROS  negative genitourinary   Musculoskeletal   Abdominal   Peds  Hematology negative hematology ROS (+)   Anesthesia Other Findings   Reproductive/Obstetrics negative OB ROS                       Anesthesia Physical Anesthesia Plan  ASA: II  Anesthesia Plan: MAC   Post-op Pain Management:    Induction: Intravenous  Airway Management Planned: Simple Face Mask  Additional Equipment:   Intra-op Plan:   Post-operative Plan:   Informed Consent: I have reviewed the patients History and Physical, chart, labs and discussed the procedure including the risks, benefits and alternatives for the proposed anesthesia with the patient or authorized representative who has indicated his/her understanding and acceptance.   Dental Advisory Given  Plan Discussed with: CRNA, Surgeon and Anesthesiologist  Anesthesia Plan Comments:        Anesthesia Quick Evaluation

## 2013-12-19 NOTE — H&P (View-Only) (Signed)
Chief Complaint  Patient presents with  . New Evaluation    peri-anal fistula    HISTORY: Michael Randall is a 58 y.o. male who presents to the office with recurrent perianal abscesses.  This had been occurring since Jan. His bowel habits are regular and his bowel movements are soft.  He denies constipation or diarrhea.  His fiber intake is dietary.  His last colonoscopy was about 4 yrs ago and was normal per pt.       History reviewed. No pertinent past medical history.    Past Surgical History  Procedure Laterality Date  . Nasal sinus surgery  2009        Current Outpatient Prescriptions  Medication Sig Dispense Refill  . atorvastatin (LIPITOR) 20 MG tablet Take 20 mg by mouth daily.      Marland Kitchen. oxyCODONE-acetaminophen (PERCOCET/ROXICET) 5-325 MG per tablet       . sertraline (ZOLOFT) 25 MG tablet        No current facility-administered medications for this visit.      Allergies  Allergen Reactions  . Augmentin [Amoxicillin-Pot Clavulanate] Hives      Family History  Problem Relation Age of Onset  . Cancer Mother      breast  . Heart disease Father   . Cancer Sister     breast  . Cancer Maternal Grandmother     breast ca    History   Social History  . Marital Status: Married    Spouse Name: N/A    Number of Children: N/A  . Years of Education: N/A   Social History Main Topics  . Smoking status: Former Smoker    Quit date: 09/18/2006  . Smokeless tobacco: Never Used     Comment: quit cigars 7 yrs ago  . Alcohol Use: Yes     Comment: occasionall  . Drug Use: No  . Sexual Activity: None   Other Topics Concern  . None   Social History Narrative  . None      REVIEW OF SYSTEMS - PERTINENT POSITIVES ONLY: Review of Systems - General ROS: negative for - chills, fever or weight loss Hematological and Lymphatic ROS: negative for - bleeding problems, blood clots or bruising Respiratory ROS: no cough, shortness of breath, or wheezing Cardiovascular ROS: no  chest pain or dyspnea on exertion Gastrointestinal ROS: negative Genito-Urinary ROS: no dysuria, trouble voiding, or hematuria  EXAM: Filed Vitals:   12/02/13 1155  BP: 130/88  Pulse: 78  Temp: 97.2 F (36.2 C)  Resp: 14    General appearance: alert and cooperative Resp: clear to auscultation bilaterally CV: RRR GI: soft, nontender  Anal Exam Findings: moderate sphincter tone, long sphincter, L lat ext opening with min drainage, no fluctuant masses    ASSESSMENT AND PLAN: Michael LappingLonnie M Nikolai is a 58 y.o. 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  I have recommended anal exam under anesthesia. If we do find a fistula we will do a fistulotomy or a seton depending on the depth of the fistula. We discussed the risk and benefits of both of these. We discussed that there is a small risk of incontinence with a fistulotomy. We discussed that if a seton is placed he would need additional surgery to correct the fistula in the future. He is agreeable to this and we will schedule this at his convenience.    Vanita PandaAlicia C Huntley Demedeiros, MD Colon and Rectal Surgery / General Surgery [facility]  Visit Diagnoses: No diagnosis found.  Primary Care Physician: Lupita RaiderSHAW,KIMBERLEE, MD

## 2013-12-19 NOTE — Op Note (Signed)
12/19/2013  1:19 PM  PATIENT:  Michael Randall  58 y.o. male  Patient Care Team: Lupita RaiderKimberlee Shaw, MD as PCP - General (Family Medicine)  PRE-OPERATIVE DIAGNOSIS:  anal fistula  POST-OPERATIVE DIAGNOSIS:  anal fistula  PROCEDURE:  ANAL EXAM UNDER ANESTHESIA, PLACEMENT OF SETON  SURGEON:  Surgeon(s): Romie LeveeAlicia Jaydeen Darley, MD  ASSISTANT: none   ANESTHESIA:   local and MAC  SPECIMEN:  No Specimen  DISPOSITION OF SPECIMEN:  N/A  COUNTS:  YES  PLAN OF CARE: Discharge to home after PACU  PATIENT DISPOSITION:  PACU - hemodynamically stable.  INDICATION: 58 y.o. M with signs and symptoms concerning for anal fistula.     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  MAC anesthesia was induced without difficulty. The patient was then positioned in prone jackknife position with buttocks gently taped apart.  The patient was then prepped and draped in usual sterile fashion.  SCDs were noted to be in place prior to the initiation of anesthesia. A surgical timeout was performed indicating the correct patient, procedure, positioning and need for preoperative antibiotics.  A rectal block was performed.  I began with a digital rectal exam.  There were no palpable masses.  I then placed a Hill-Ferguson anoscope into the anal canal and evaluated this completely.  A small defect could be noted at posterior midline.  I used and S shaped fistula probe to identify the tract.  Hydrogen peroxide was used to confirm the tract.  The probe was advanced through the internal opening.  The external portion was incised to the level of the external sphincter.  The tract appeared to traverse under the entire EAS.  It was decided to place a seton.  This was done using ethibond suture and a vessel loop.  The tract was marsupialized  using a 3-0 Chromic suture.  A dressing was applied.  The patient was awakened from anesthesia and sent to the PACU in stable condition.  The patient tolerated the procedure well.  All counts were correct per OR staff.

## 2013-12-19 NOTE — Transfer of Care (Signed)
Immediate Anesthesia Transfer of Care Note  Patient: Michael Randall  Procedure(s) Performed: Procedure(s): EXAM UNDER ANESTHESIA , ANAL FISTULOTOMY (N/A)  PLACEMENT OF SETON (N/A)  Patient Location: PACU  Anesthesia Type:MAC  Level of Consciousness: awake, alert , oriented and patient cooperative  Airway & Oxygen Therapy: Patient Spontanous Breathing and Patient connected to nasal cannula oxygen  Post-op Assessment: Report given to PACU RN and Post -op Vital signs reviewed and stable  Post vital signs: Reviewed and stable  Complications: No apparent anesthesia complications

## 2013-12-19 NOTE — Interval H&P Note (Signed)
History and Physical Interval Note:  12/19/2013 12:31 PM  Michael Randall  has presented today for surgery, with the diagnosis of anal fistula  The various methods of treatment have been discussed with the patient and family. After consideration of risks, benefits and other options for treatment, the patient has consented to  Procedure(s): EXAM UNDER ANESTHESIA WITH POSSIBLE ANAL FISTULOTOMY (N/A) POSSIBLE PLACEMENT OF SETON (N/A) as a surgical intervention .  The patient's history has been reviewed, patient examined, no change in status, stable for surgery.  I have reviewed the patient's chart and labs.  Questions were answered to the patient's satisfaction.     [name]

## 2013-12-22 ENCOUNTER — Encounter (HOSPITAL_BASED_OUTPATIENT_CLINIC_OR_DEPARTMENT_OTHER): Payer: Self-pay | Admitting: General Surgery

## 2013-12-22 NOTE — Anesthesia Postprocedure Evaluation (Signed)
  Anesthesia Post-op Note  Patient: Demetrio LappingLonnie M Wendt  Procedure(s) Performed: Procedure(s) (LRB): EXAM UNDER ANESTHESIA , ANAL FISTULOTOMY (N/A)  PLACEMENT OF SETON (N/A)  Patient Location: PACU  Anesthesia Type: MAC  Level of Consciousness: awake and alert   Airway and Oxygen Therapy: Patient Spontanous Breathing  Post-op Pain: mild  Post-op Assessment: Post-op Vital signs reviewed, Patient's Cardiovascular Status Stable, Respiratory Function Stable, Patent Airway and No signs of Nausea or vomiting  Last Vitals:  Filed Vitals:   12/19/13 1545  BP: 146/82  Pulse: 66  Temp: 36.7 C  Resp: 18    Post-op Vital Signs: stable   Complications: No apparent anesthesia complications

## 2013-12-30 ENCOUNTER — Encounter (INDEPENDENT_AMBULATORY_CARE_PROVIDER_SITE_OTHER): Payer: Self-pay | Admitting: General Surgery

## 2013-12-30 ENCOUNTER — Encounter (INDEPENDENT_AMBULATORY_CARE_PROVIDER_SITE_OTHER): Payer: BC Managed Care – PPO | Admitting: General Surgery

## 2013-12-30 ENCOUNTER — Ambulatory Visit (INDEPENDENT_AMBULATORY_CARE_PROVIDER_SITE_OTHER): Payer: BC Managed Care – PPO | Admitting: General Surgery

## 2013-12-30 VITALS — BP 120/65 | HR 62 | Temp 97.6°F | Resp 14 | Ht 73.0 in | Wt 254.8 lb

## 2013-12-30 DIAGNOSIS — K603 Anal fistula: Secondary | ICD-10-CM

## 2013-12-30 NOTE — Patient Instructions (Signed)
Return to the office in 6-8 weeks to discuss surgery.  Use sitz bath after bowel movements and as needed.  Call the office if you have any concerns or questions.

## 2013-12-30 NOTE — Progress Notes (Signed)
Michael Randall is a 58 y.o. male who is status post a seton placement on 5/15.  He is having some pain that comes and goes  Objective: Filed Vitals:   12/30/13 1617  BP: 120/65  Pulse: 62  Temp: 97.6 F (36.4 C)  Resp: 14    General appearance: alert and cooperative GI: normal findings: soft, non-tender  Incision: healing well   Assessment: s/p  Patient Active Problem List   Diagnosis Date Noted  . 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  RTO in 6-8 weeks for discussion of surgical options.      Vanita Panda, MD [facility], [place] [phone]   12/30/2013 4:27 PM

## 2014-01-02 ENCOUNTER — Telehealth (INDEPENDENT_AMBULATORY_CARE_PROVIDER_SITE_OTHER): Payer: Self-pay

## 2014-01-02 NOTE — Telephone Encounter (Signed)
Pt called in to question if his upcoming surgery would be an overnight stay. I advised him per Dr. Maisie Fus that there would be NO overnight stay.  Pt also questioned if there was something to be prescribed for pain. Pt said pain level was 4-5 at home and 6-7 after working all day.  Advised pt per Dr. Maisie Fus that he would need to bump up his sitz baths. Pt voiced understanding.

## 2014-02-25 ENCOUNTER — Ambulatory Visit (INDEPENDENT_AMBULATORY_CARE_PROVIDER_SITE_OTHER): Payer: BC Managed Care – PPO | Admitting: General Surgery

## 2014-02-25 ENCOUNTER — Encounter (INDEPENDENT_AMBULATORY_CARE_PROVIDER_SITE_OTHER): Payer: Self-pay | Admitting: General Surgery

## 2014-02-25 VITALS — BP 126/86 | HR 82 | Temp 98.0°F | Ht 73.0 in | Wt 253.0 lb

## 2014-02-25 DIAGNOSIS — K6289 Other specified diseases of anus and rectum: Secondary | ICD-10-CM

## 2014-02-25 MED ORDER — LIDOCAINE 5 % EX OINT: 1.0000 "application " | TOPICAL_OINTMENT | CUTANEOUS | Status: DC | PRN

## 2014-02-25 NOTE — Progress Notes (Signed)
Michael ClickLonnie Dutko Jr. is a 58 y.o. male who is here for a follow up visit regarding his anal fistula.  His pain and drainage have lessoned.  He is doing his normal activities.    Objective: Filed Vitals:   02/25/14 1353  BP: 126/86  Pulse: 82  Temp: 98 F (36.7 C)    General appearance: alert and cooperative GI: normal findings: soft, non-tender   Assessment and Plan: We discussed fistula repair with LIFT.  I explained the risks and benefits of this compared to mucosal advancement flap and fistulotomy.  We discussed the risk of recurrence is about 20-25%.  Other risks include bleeding, infection and problems with healing and scarring.      Vanita PandaAlicia C Merlin Golden, MD [facility], [place] [phone]

## 2014-02-25 NOTE — Patient Instructions (Signed)
We will plan on doing a LIFT (ligation of internal fistula tract) procedure and a mucosa advancement flap as a back up if we cannot do the LIFT.

## 2014-06-26 ENCOUNTER — Encounter (HOSPITAL_BASED_OUTPATIENT_CLINIC_OR_DEPARTMENT_OTHER): Payer: Self-pay | Admitting: *Deleted

## 2014-06-26 NOTE — Progress Notes (Signed)
NPO AFTER MN. ARRIVE AT 0730. NEEDS HG. 

## 2014-06-30 ENCOUNTER — Other Ambulatory Visit (INDEPENDENT_AMBULATORY_CARE_PROVIDER_SITE_OTHER): Payer: Self-pay | Admitting: General Surgery

## 2014-07-01 ENCOUNTER — Encounter (HOSPITAL_BASED_OUTPATIENT_CLINIC_OR_DEPARTMENT_OTHER)
Admission: RE | Disposition: A | Discharge status: Home / Self Care | Payer: Self-pay | Source: Ambulatory Visit | Attending: General Surgery

## 2014-07-01 ENCOUNTER — Encounter (HOSPITAL_BASED_OUTPATIENT_CLINIC_OR_DEPARTMENT_OTHER): Payer: Self-pay | Admitting: *Deleted

## 2014-07-01 ENCOUNTER — Ambulatory Visit (HOSPITAL_BASED_OUTPATIENT_CLINIC_OR_DEPARTMENT_OTHER): Payer: BC Managed Care – PPO | Admitting: Anesthesiology

## 2014-07-01 ENCOUNTER — Ambulatory Visit (HOSPITAL_BASED_OUTPATIENT_CLINIC_OR_DEPARTMENT_OTHER)
Admission: RE | Admit: 2014-07-01 | Discharge: 2014-07-01 | Disposition: A | Discharge status: Home / Self Care | Payer: BC Managed Care – PPO | Source: Ambulatory Visit | Attending: General Surgery | Admitting: General Surgery

## 2014-07-01 DIAGNOSIS — K603 Anal fistula: Secondary | ICD-10-CM | POA: Insufficient documentation

## 2014-07-01 DIAGNOSIS — Z87891 Personal history of nicotine dependence: Secondary | ICD-10-CM | POA: Insufficient documentation

## 2014-07-01 DIAGNOSIS — K219 Gastro-esophageal reflux disease without esophagitis: Secondary | ICD-10-CM | POA: Diagnosis not present

## 2014-07-01 HISTORY — PX: ANAL FISTULECTOMY: SHX1139

## 2014-07-01 LAB — POCT HEMOGLOBIN-HEMACUE: Hemoglobin: 12.9 g/dL — ABNORMAL LOW (ref 13.0–17.0)

## 2014-07-01 SURGERY — FISTULECTOMY, ANAL
Anesthesia: Monitor Anesthesia Care | Site: Rectum

## 2014-07-01 MED ORDER — PROPOFOL 10 MG/ML IV BOLUS
INTRAVENOUS | Status: DC | PRN
  Administered 2014-07-01 (×2): 40 mg via INTRAVENOUS

## 2014-07-01 MED ORDER — FENTANYL CITRATE 0.05 MG/ML IJ SOLN
25.0000 ug | INTRAMUSCULAR | Status: DC | PRN
  Administered 2014-07-01: 50 ug via INTRAVENOUS
  Filled 2014-07-01: qty 1

## 2014-07-01 MED ORDER — KETOROLAC TROMETHAMINE 30 MG/ML IJ SOLN
INTRAMUSCULAR | Status: DC | PRN
  Administered 2014-07-01: 30 mg via INTRAVENOUS

## 2014-07-01 MED ORDER — DEXAMETHASONE SODIUM PHOSPHATE 4 MG/ML IJ SOLN
INTRAMUSCULAR | Status: DC | PRN
  Administered 2014-07-01: 10 mg via INTRAVENOUS

## 2014-07-01 MED ORDER — PROPOFOL 10 MG/ML IV EMUL
INTRAVENOUS | Status: DC | PRN
  Administered 2014-07-01: 50 ug/kg/min via INTRAVENOUS

## 2014-07-01 MED ORDER — OXYCODONE HCL 5 MG PO TABS: 5.0000 mg | ORAL_TABLET | ORAL | Status: DC | PRN

## 2014-07-01 MED ORDER — ACETAMINOPHEN 325 MG PO TABS
650.0000 mg | ORAL_TABLET | ORAL | Status: DC | PRN
  Filled 2014-07-01: qty 2

## 2014-07-01 MED ORDER — 0.9 % SODIUM CHLORIDE (POUR BTL) OPTIME
TOPICAL | Status: DC | PRN
  Administered 2014-07-01: 500 mL

## 2014-07-01 MED ORDER — PROMETHAZINE HCL 25 MG/ML IJ SOLN
6.2500 mg | INTRAMUSCULAR | Status: DC | PRN
  Filled 2014-07-01: qty 1

## 2014-07-01 MED ORDER — ACETAMINOPHEN 650 MG RE SUPP
650.0000 mg | RECTAL | Status: DC | PRN
  Filled 2014-07-01: qty 1

## 2014-07-01 MED ORDER — BUPIVACAINE-EPINEPHRINE 0.5% -1:200000 IJ SOLN
INTRAMUSCULAR | Status: DC | PRN
  Administered 2014-07-01: 30 mL

## 2014-07-01 MED ORDER — SODIUM CHLORIDE 0.9 % IJ SOLN
3.0000 mL | INTRAMUSCULAR | Status: DC | PRN
Start: 2014-07-01 — End: 2014-07-01
  Filled 2014-07-01: qty 3

## 2014-07-01 MED ORDER — SODIUM CHLORIDE 0.9 % IJ SOLN
3.0000 mL | Freq: Two times a day (BID) | INTRAMUSCULAR | Status: DC
  Filled 2014-07-01: qty 3

## 2014-07-01 MED ORDER — FENTANYL CITRATE 0.05 MG/ML IJ SOLN
INTRAMUSCULAR | Status: AC
  Filled 2014-07-01: qty 4

## 2014-07-01 MED ORDER — ACETAMINOPHEN 10 MG/ML IV SOLN
INTRAVENOUS | Status: DC | PRN
  Administered 2014-07-01: 1000 mg via INTRAVENOUS

## 2014-07-01 MED ORDER — FENTANYL CITRATE 0.05 MG/ML IJ SOLN
INTRAMUSCULAR | Status: DC | PRN
  Administered 2014-07-01 (×4): 50 ug via INTRAVENOUS

## 2014-07-01 MED ORDER — LIDOCAINE 5 % EX OINT
TOPICAL_OINTMENT | CUTANEOUS | Status: DC | PRN
  Administered 2014-07-01: 1

## 2014-07-01 MED ORDER — LIDOCAINE HCL (CARDIAC) 20 MG/ML IV SOLN
INTRAVENOUS | Status: DC | PRN
  Administered 2014-07-01: 50 mg via INTRAVENOUS

## 2014-07-01 MED ORDER — OXYCODONE HCL 5 MG PO TABS
ORAL_TABLET | ORAL | Status: AC
  Filled 2014-07-01: qty 1

## 2014-07-01 MED ORDER — MIDAZOLAM HCL 5 MG/5ML IJ SOLN
INTRAMUSCULAR | Status: DC | PRN
  Administered 2014-07-01 (×2): 2 mg via INTRAVENOUS

## 2014-07-01 MED ORDER — SODIUM CHLORIDE 0.9 % IV SOLN
250.0000 mL | INTRAVENOUS | Status: DC | PRN
  Filled 2014-07-01: qty 250

## 2014-07-01 MED ORDER — ONDANSETRON HCL 4 MG/2ML IJ SOLN
INTRAMUSCULAR | Status: DC | PRN
  Administered 2014-07-01: 4 mg via INTRAVENOUS

## 2014-07-01 MED ORDER — OXYCODONE HCL 5 MG PO TABS
5.0000 mg | ORAL_TABLET | ORAL | Status: DC | PRN
  Administered 2014-07-01: 5 mg via ORAL
  Filled 2014-07-01: qty 2

## 2014-07-01 MED ORDER — MIDAZOLAM HCL 2 MG/2ML IJ SOLN
INTRAMUSCULAR | Status: AC
  Filled 2014-07-01: qty 4

## 2014-07-01 MED ORDER — FENTANYL CITRATE 0.05 MG/ML IJ SOLN
INTRAMUSCULAR | Status: AC
  Filled 2014-07-01: qty 2

## 2014-07-01 MED ORDER — LACTATED RINGERS IV SOLN
INTRAVENOUS | Status: DC
  Administered 2014-07-01 (×2): via INTRAVENOUS
  Filled 2014-07-01: qty 1000

## 2014-07-01 SURGICAL SUPPLY — 58 items
BENZOIN TINCTURE PRP APPL 2/3 (GAUZE/BANDAGES/DRESSINGS) ×4 IMPLANT
BLADE HEX COATED 2.75 (ELECTRODE) ×2 IMPLANT
BLADE SURG 10 STRL SS (BLADE) IMPLANT
BLADE SURG 15 STRL LF DISP TIS (BLADE) ×1 IMPLANT
BLADE SURG 15 STRL SS (BLADE) ×1
BRIEF STRETCH FOR OB PAD LRG (UNDERPADS AND DIAPERS) ×4 IMPLANT
CANISTER SUCTION 2500CC (MISCELLANEOUS) ×2 IMPLANT
CLOTH BEACON ORANGE TIMEOUT ST (SAFETY) ×2 IMPLANT
COVER MAYO STAND STRL (DRAPES) ×2 IMPLANT
COVER TABLE BACK 60X90 (DRAPES) ×2 IMPLANT
DECANTER SPIKE VIAL GLASS SM (MISCELLANEOUS) ×2 IMPLANT
DRAPE LG THREE QUARTER DISP (DRAPES) IMPLANT
DRAPE PED LAPAROTOMY (DRAPES) ×2 IMPLANT
DRAPE UNDERBUTTOCKS STRL (DRAPE) IMPLANT
DRAPE UTILITY XL STRL (DRAPES) ×2 IMPLANT
DRSG PAD ABDOMINAL 8X10 ST (GAUZE/BANDAGES/DRESSINGS) ×2 IMPLANT
ELECT BLADE 6.5 .24CM SHAFT (ELECTRODE) IMPLANT
ELECT REM PT RETURN 9FT ADLT (ELECTROSURGICAL) ×2
ELECTRODE REM PT RTRN 9FT ADLT (ELECTROSURGICAL) ×1 IMPLANT
GAUZE SPONGE 4X4 16PLY XRAY LF (GAUZE/BANDAGES/DRESSINGS) ×2 IMPLANT
GAUZE VASELINE 3X9 (GAUZE/BANDAGES/DRESSINGS) IMPLANT
GLOVE BIO SURGEON STRL SZ 6.5 (GLOVE) ×6 IMPLANT
GLOVE INDICATOR 7.0 STRL GRN (GLOVE) ×4 IMPLANT
GOWN PREVENTION PLUS LG XLONG (DISPOSABLE) ×2 IMPLANT
GOWN PREVENTION PLUS XLARGE (GOWN DISPOSABLE) ×2 IMPLANT
GOWN STRL REIN XL XLG (GOWN DISPOSABLE) ×2 IMPLANT
HOOK RETRACTION 12 ELAST STAY (MISCELLANEOUS) IMPLANT
IV CATH 14GX2 1/4 (CATHETERS) ×2 IMPLANT
LEGGING LITHOTOMY PAIR STRL (DRAPES) IMPLANT
LOOP VESSEL MAXI BLUE (MISCELLANEOUS) IMPLANT
NDL SAFETY ECLIPSE 18X1.5 (NEEDLE) IMPLANT
NEEDLE HYPO 18GX1.5 SHARP (NEEDLE)
NEEDLE HYPO 25X1 1.5 SAFETY (NEEDLE) ×2 IMPLANT
NS IRRIG 500ML POUR BTL (IV SOLUTION) ×2 IMPLANT
PACK BASIN DAY SURGERY FS (CUSTOM PROCEDURE TRAY) ×2 IMPLANT
PAD ABD 8X10 STRL (GAUZE/BANDAGES/DRESSINGS) ×2 IMPLANT
PENCIL BUTTON HOLSTER BLD 10FT (ELECTRODE) ×2 IMPLANT
RETRACTOR STAY HOOK 5MM (MISCELLANEOUS) ×2 IMPLANT
RETRACTOR STERILE 25.8CMX11.3 (INSTRUMENTS) ×2 IMPLANT
SPONGE GAUZE 4X4 12PLY STER LF (GAUZE/BANDAGES/DRESSINGS) ×2 IMPLANT
SPONGE SURGIFOAM ABS GEL 12-7 (HEMOSTASIS) IMPLANT
SUCTION FRAZIER TIP 10 FR DISP (SUCTIONS) ×2 IMPLANT
SUT CHROMIC 2 0 SH (SUTURE) ×2 IMPLANT
SUT CHROMIC 3 0 SH 27 (SUTURE) ×4 IMPLANT
SUT ETHIBOND 0 (SUTURE) IMPLANT
SUT SILK 2 0 (SUTURE)
SUT SILK 2 0 SH (SUTURE) ×4 IMPLANT
SUT SILK 2-0 18XBRD TIE 12 (SUTURE) IMPLANT
SUT SILK 3 0 SH 30 (SUTURE) ×4 IMPLANT
SUT VIC AB 2-0 SH 18 (SUTURE) ×2 IMPLANT
SUT VIC AB 3-0 SH 18 (SUTURE) ×2 IMPLANT
SUT VIC AB 4-0 P-3 18XBRD (SUTURE) IMPLANT
SUT VIC AB 4-0 P3 18 (SUTURE)
SYR CONTROL 10ML LL (SYRINGE) ×2 IMPLANT
TOWEL OR 17X24 6PK STRL BLUE (TOWEL DISPOSABLE) ×2 IMPLANT
TRAY DSU PREP LF (CUSTOM PROCEDURE TRAY) ×2 IMPLANT
TUBE CONNECTING 12X1/4 (SUCTIONS) ×4 IMPLANT
YANKAUER SUCT BULB TIP NO VENT (SUCTIONS) ×2 IMPLANT

## 2014-07-01 NOTE — H&P (Signed)
.   New Evaluation    peri-anal fistula    HISTORY: Michael Randall is a 58 y.o. male who presented to the office with recurrent perianal abscesses. This had been occurring since Jan. His bowel habits are regular and his bowel movements are soft. He denies constipation or diarrhea. His fiber intake is dietary. His last colonoscopy was about 4 yrs ago and was normal per pt.He underwent a seton placement in June and is here today for definitive management of his fistula.  History reviewed. No pertinent past medical history.   Past Surgical History  Procedure Laterality Date  . Nasal sinus surgery  2009     Current Outpatient Prescriptions  Medication Sig Dispense Refill  . atorvastatin (LIPITOR) 20 MG tablet Take 20 mg by mouth daily.    Marland Kitchen. oxyCODONE-acetaminophen (PERCOCET/ROXICET) 5-325 MG per tablet     . sertraline (ZOLOFT) 25 MG tablet      No current facility-administered medications for this visit.     Allergies  Allergen Reactions  . Augmentin [Amoxicillin-Pot Clavulanate] Hives     Family History  Problem Relation Age of Onset  . Cancer Mother     breast  . Heart disease Father   . Cancer Sister     breast  . Cancer Maternal Grandmother     breast ca    History   Social History  . Marital Status: Married    Spouse Name: N/A    Number of Children: N/A  . Years of Education: N/A   Social History Main Topics  . Smoking status: Former Smoker    Quit date: 09/18/2006  . Smokeless tobacco: Never Used     Comment: quit cigars 7 yrs ago  . Alcohol Use: Yes     Comment: occasionall  . Drug Use: No  . Sexual Activity: None   Other Topics Concern  . None   Social History Narrative  . None     REVIEW OF SYSTEMS - PERTINENT POSITIVES ONLY: Review of Systems - General ROS: negative for - chills, fever  or weight loss Hematological and Lymphatic ROS: negative for - bleeding problems, blood clots or bruising Respiratory ROS: no cough, shortness of breath, or wheezing Cardiovascular ROS: no chest pain or dyspnea on exertion Gastrointestinal ROS: negative Genito-Urinary ROS: no dysuria, trouble voiding, or hematuria  EXAM: BP 138/76 mmHg  Pulse 64  Temp(Src) 98.2 F (36.8 C) (Oral)  Resp 16  Ht 6\' 1"  (1.854 m)  Wt 251 lb (113.853 kg)  BMI 33.12 kg/m2  SpO2 97%  General appearance: alert and cooperative Resp: clear to auscultation bilaterally CV: RRR GI: soft, nontender  Anal Exam Findings: moderate sphincter tone, long sphincter, L lat ext opening with min drainage, no fluctuant masses    ASSESSMENT AND PLAN: Michael LappingLonnie M Kong is a 58 y.o. M with an anal fistula.He has had a seton in place for ~5 months.  He is here today to get a LIFT procedure.  We discussed that there is a higher chance of recurrence with this repair but a lower chance of incontinence when compared to a fistulotomy.  Other risks include bleeding and pain.  All questions were answered.  He is ready to proceed.     Vanita PandaAlicia C Silvano Garofano, MD Colon and Rectal Surgery / General Surgery [facility]

## 2014-07-01 NOTE — Anesthesia Preprocedure Evaluation (Signed)
Anesthesia Evaluation  Patient identified by MRN, date of birth, ID band Patient awake    Reviewed: Allergy & Precautions, H&P , NPO status , Patient's Chart, lab work & pertinent test results  Airway Mallampati: II  TM Distance: >3 FB Neck ROM: Full    Dental no notable dental hx.    Pulmonary neg pulmonary ROS, former smoker,  breath sounds clear to auscultation  Pulmonary exam normal       Cardiovascular negative cardio ROS  Rhythm:Regular Rate:Normal     Neuro/Psych negative neurological ROS  negative psych ROS   GI/Hepatic Neg liver ROS, GERD-  Medicated,  Endo/Other  negative endocrine ROS  Renal/GU negative Renal ROS  negative genitourinary   Musculoskeletal negative musculoskeletal ROS (+)   Abdominal   Peds negative pediatric ROS (+)  Hematology negative hematology ROS (+)   Anesthesia Other Findings   Reproductive/Obstetrics negative OB ROS                             Anesthesia Physical Anesthesia Plan  ASA: II  Anesthesia Plan: MAC   Post-op Pain Management:    Induction: Intravenous  Airway Management Planned: Simple Face Mask  Additional Equipment:   Intra-op Plan:   Post-operative Plan:   Informed Consent: I have reviewed the patients History and Physical, chart, labs and discussed the procedure including the risks, benefits and alternatives for the proposed anesthesia with the patient or authorized representative who has indicated his/her understanding and acceptance.   Dental advisory given  Plan Discussed with: CRNA and Surgeon  Anesthesia Plan Comments:         Anesthesia Quick Evaluation

## 2014-07-01 NOTE — Anesthesia Postprocedure Evaluation (Signed)
  Anesthesia Post-op Note  Patient: Michael Randall.  Procedure(s) Performed: Procedure(s) (LRB): LIGATION OF INTERNAL FISTULA TRACT (N/A)  Patient Location: PACU  Anesthesia Type: MAC  Level of Consciousness: awake and alert   Airway and Oxygen Therapy: Patient Spontanous Breathing  Post-op Pain: mild  Post-op Assessment: Post-op Vital signs reviewed, Patient's Cardiovascular Status Stable, Respiratory Function Stable, Patent Airway and No signs of Nausea or vomiting  Last Vitals:  Filed Vitals:   07/01/14 1015  BP: 119/75  Pulse: 73  Temp:   Resp: 14    Post-op Vital Signs: stable   Complications: No apparent anesthesia complications

## 2014-07-01 NOTE — Discharge Instructions (Addendum)
ANORECTAL SURGERY: POST OP INSTRUCTIONS °1. Take your usually prescribed home medications unless otherwise directed. °2. DIET: During the first few hours after surgery sip on some liquids until you are able to urinate.  It is normal to not urinate for several hours after this surgery.  If you feel uncomfortable, please contact the office for instructions.  After you are able to urinate,you may eat, if you feel like it.  Follow a light bland diet the first 24 hours after arrival home, such as soup, liquids, crackers, etc.  Be sure to include lots of fluids daily (6-8 glasses).  Avoid fast food or heavy meals, as your are more likely to get nauseated.  Eat a low fat diet the next few days after surgery.  Limit caffeine intake to 1-2 servings a day. °3. PAIN CONTROL: °a. Pain is best controlled by a usual combination of several different methods TOGETHER: °i. Muscle relaxation °1.  Soak in a warm bath (or Sitz bath) three times a day and after bowel movements.  Continue to do this until all pain is resolved. °2. Take the muscle relaxer (Valium) every 6 hours for the first 2 days after surgery  °ii. Over the counter pain medication °iii. Prescription pain medication °b. Most patients will experience some swelling and discomfort in the anus/rectal area and incisions.  Heat such as warm towels, sitz baths, warm baths, etc to help relax tight/sore spots and speed recovery.  Some people prefer to use ice, especially in the first couple days after surgery, as it may decrease the pain and swelling, or alternate between ice & heat.  Experiment to what works for you.  Swelling and bruising can take several weeks to resolve.  Pain can take even longer to completely resolve. °c. It is helpful to take an over-the-counter pain medication regularly for the first few weeks.  Choose one of the following that works best for you: °i. Naproxen (Aleve, etc)  Two 220mg tabs twice a day °ii. Ibuprofen (Advil, etc) Three 200mg tabs four  times a day (every meal & bedtime) °d. A  prescription for pain medication (such as percocet, oxycodone, hydrocodone, etc) should be given to you upon discharge.  Take your pain medication as prescribed.  °i. If you are having problems/concerns with the prescription medicine (does not control pain, nausea, vomiting, rash, itching, etc), please call us [phone] to see if we need to switch you to a different pain medicine that will work better for you and/or control your side effect better. °ii. If you need a refill on your pain medication, please contact your pharmacy.  They will contact our office to request authorization. Prescriptions will not be filled after 5 pm or on week-ends. °4. KEEP YOUR BOWELS REGULAR and AVOID CONSTIPATION °a. The goal is one to two soft bowel movements a day.  You should at least have a bowel movement every other day. °b. Avoid getting constipated.  Between the surgery and the pain medications, it is common to experience some constipation. This can be very painful after rectal surgery.  Increasing fluid intake and taking a fiber supplement (such as Metamucil, Citrucel, FiberCon, etc) 1-2 times a day regularly will usually help prevent this problem from occurring.  A stool softener like colace is also recommended.  This can be purchased over the counter at your pharmacy.  You can take it up to 3 times a day.  If you do not have a bowel movement after 24 hrs since your surgery,   take one does of milk of magnesia.  If you still haven't had a bowel movement 8-12 hours after that dose, take another dose.  If you don't have a bowel movement 48 hrs after surgery, purchase a Fleets enema from the drug store and administer gently per package instructions.  If you still are having trouble with your bowel movements after that, please call the office for further instructions. °c. If you develop diarrhea or have many loose bowel movements, simplify your diet to bland foods & liquids for a few  days.  Stop any stool softeners and decrease your fiber supplement.  Switching to mild anti-diarrheal medications (Kayopectate, Pepto Bismol) can help.  If this worsens or does not improve, please call us. ° °5. Wound Care °a. Remove your bandages before your first bowel movement or 8 hours after surgery.     °b. Remove any wound packing material at this tim,e as well.  You do not need to repack the wound unless instructed otherwise.  Wear an absorbent pad or soft cotton gauze in your underwear to catch any drainage and help keep the area clean. You should change this every 2-3 hours while awake. °c. Keep the area clean and dry.  Bathe / shower every day, especially after bowel movements.  Keep the area clean by showering / bathing over the incision / wound.   It is okay to soak an open wound to help wash it.  Wet wipes or showers / gentle washing after bowel movements is often less traumatic than regular toilet paper. °d. You may have some styrofoam-like soft packing in the rectum which will come out with the first bowel movement.  °e. You will often notice bleeding with bowel movements.  This should slow down by the end of the first week of surgery °f. Expect some drainage.  This should slow down, too, by the end of the first week of surgery.  Wear an absorbent pad or soft cotton gauze in your underwear until the drainage stops. °g. Do Not sit on a rubber or pillow ring.  This can make you symptoms worse.  You may sit on a soft pillow if needed.  °6. ACTIVITIES as tolerated:   °a. You may resume regular (light) daily activities beginning the next day--such as daily self-care, walking, climbing stairs--gradually increasing activities as tolerated.  If you can walk 30 minutes without difficulty, it is safe to try more intense activity such as jogging, treadmill, bicycling, low-impact aerobics, swimming, etc. °b. Save the most intensive and strenuous activity for last such as sit-ups, heavy lifting, contact sports,  etc  Refrain from any heavy lifting or straining until you are off narcotics for pain control.   °c. You may drive when you are no longer taking prescription pain medication, you can comfortably sit for long periods of time, and you can safely maneuver your car and apply brakes. °d. You may have sexual intercourse when it is comfortable.  °7. FOLLOW UP in our office °a. Please call [facility] at [phone] to set up an appointment to see your surgeon in the office for a follow-up appointment approximately 3-4 weeks after your surgery. °b. Make sure that you call for this appointment the day you arrive home to insure a convenient appointment time. °10. IF YOU HAVE DISABILITY OR FAMILY LEAVE FORMS, BRING THEM TO THE OFFICE FOR PROCESSING.  DO NOT GIVE THEM TO YOUR DOCTOR. ° ° ° ° °WHEN TO CALL US [phone]: °1. Poor pain control °  2. Reactions / problems with new medications (rash/itching, nausea, etc)  °3. Fever over 101.5 F (38.5 C) °4. Inability to urinate °5. Nausea and/or vomiting °6. Worsening swelling or bruising °7. Continued bleeding from incision. °8. Increased pain, redness, or drainage from the incision ° °The clinic staff is available to answer your questions during regular business hours (8:30am-5pm).  Please don’t hesitate to call and ask to speak to one of our nurses for clinical concerns.   A surgeon from [facility] is always on call at the hospitals °  °If you have a medical emergency, go to the nearest emergency room or call 911. °  ° °[facility] °[address], [address], [place], [place]  [address] ? °MAIN: [phone] ? TOLL FREE: [phone] ? °FAX [phone] °[email or web address] ° ° ° ° °Post Anesthesia Home Care Instructions ° °Activity: °Get plenty of rest for the remainder of the day. A responsible adult should stay with you for 24 hours following the procedure.  °For the next 24 hours, DO NOT: °-Drive a car °-Operate  machinery °-Drink alcoholic beverages °-Take any medication unless instructed by your physician °-Make any legal decisions or sign important papers. ° °Meals: °Start with liquid foods such as gelatin or soup. Progress to regular foods as tolerated. Avoid greasy, spicy, heavy foods. If nausea and/or vomiting occur, drink only clear liquids until the nausea and/or vomiting subsides. Call your physician if vomiting continues. ° °Special Instructions/Symptoms: °Your throat may feel dry or sore from the anesthesia or the breathing tube placed in your throat during surgery. If this causes discomfort, gargle with warm salt water. The discomfort should disappear within 24 hours. ° °

## 2014-07-01 NOTE — Transfer of Care (Signed)
Immediate Anesthesia Transfer of Care Note  Patient: Michael ClickLonnie Deen Jr.  Procedure(s) Performed: Procedure(s): LIGATION OF INTERNAL FISTULA TRACT (N/A)  Patient Location: PACU  Anesthesia Type:General  Level of Consciousness: awake, alert  and oriented  Airway & Oxygen Therapy: Patient Spontanous Breathing  Post-op Assessment: Report given to PACU RN  Post vital signs: Reviewed and stable  Complications: No apparent anesthesia complications

## 2014-07-01 NOTE — Op Note (Signed)
07/01/2014  10:05 AM  PATIENT:  Michael ClickLonnie Maiorino Jr.  58 y.o. male  Patient Care Team: Lupita RaiderKimberlee Shaw, MD as PCP - General (Family Medicine)  PRE-OPERATIVE DIAGNOSIS:  anal fistula  POST-OPERATIVE DIAGNOSIS:  anal fistula  PROCEDURE:  LIGATION OF INTERNAL FISTULA TRACT   Surgeon(s): Romie LeveeAlicia Adelena Desantiago, MD  ASSISTANT: none   ANESTHESIA:   MAC  SPECIMEN:  No Specimen  DISPOSITION OF SPECIMEN:  N/A  COUNTS:  YES  PLAN OF CARE: Discharge to home after PACU  PATIENT DISPOSITION:  PACU - hemodynamically stable.  INDICATION: Patient is a [age]yo M with transsphincteric anal fistula, s/p seton placement 5 months ago.     OR FINDINGS: seton in place, strong inflammatory response.    DESCRIPTION: the patient was identified in the preoperative holding area and taken to the OR where they were laid on the operating room table.  MAC anesthesia was induced without difficulty. The patient was then positioned in prone jackknife position with buttocks gently taped apart.  The patient was then prepped and draped in usual sterile fashion.  SCDs were noted to be in place prior to the initiation of anesthesia. A surgical timeout was performed indicating the correct patient, procedure, positioning and need for preoperative antibiotics.  I rectal block was performed using Marcaine with epinephrine.    I began with a digital rectal exam.  There were no abnormal masses.  I then placed a Hill-Ferguson anoscope into the anal canal and evaluated this completely.  The seton was in place in the L posterior perianal region.  The internal opening was at posterior midline.  The seton was removed.  A fistula probe was placed through the tract.  An incision was made over the intersphincteric groove.  Dissection was carried down around the fistula tract between the 2 muscles.  A right angle was used to dissect under the tract.  Two 2-0 silk sutures were passed under the tract and tied.  The tract was transected.  The  sutures were re-enforced with a 3-0 Vicryl suture on both ends.  I then injected hydrogen peroxide into the external opening.  There was a small leak that was closed with 2, 3-0 silk sutures.  The internal opening was then closed with a 3-0 chromic suture. Hydrogen peroxide was placed into the incision and there was no leak internally.  The muscle layer was then closed with interrupted 3-0 Vicryl sutures.  The skin was closed with interrupted 3-0 Chromic sutures.  The external opening was widened.  Hemostasis was good.  All counts were correct per OR staff.  A dressing was applied.  The patient was awakened from anesthesia and sent to the PACU in stable condition.

## 2014-07-01 NOTE — Anesthesia Procedure Notes (Signed)
Procedure Name: MAC Performed by: Maris BergerENENNY, Shenell Rogalski T Patient Re-evaluated:Patient Re-evaluated prior to inductionOxygen Delivery Method: Simple face mask

## 2014-07-06 ENCOUNTER — Encounter (HOSPITAL_BASED_OUTPATIENT_CLINIC_OR_DEPARTMENT_OTHER): Payer: Self-pay | Admitting: General Surgery

## 2015-10-01 ENCOUNTER — Encounter (HOSPITAL_COMMUNITY): Payer: Self-pay | Admitting: *Deleted

## 2015-10-05 ENCOUNTER — Ambulatory Visit: Payer: Self-pay | Admitting: Orthopedic Surgery

## 2015-10-05 NOTE — Progress Notes (Signed)
Preoperative surgical orders have been place into the Epic hospital system for [facility]. on 10/05/2015, 10:28 AM  by Patrica Duel for surgery on 10-13-2015.  Preop Knee Scope orders including IV Tylenol and IV Decadron as long as there are no contraindications to the above medications. Avel Peace, PA-C

## 2015-10-06 NOTE — Progress Notes (Signed)
Called pt on 10-05-15 and 10-06-15 to calle back with name of supplement he takes.

## 2015-10-08 ENCOUNTER — Other Ambulatory Visit (HOSPITAL_COMMUNITY): Payer: Self-pay | Admitting: *Deleted

## 2015-10-12 DIAGNOSIS — S83249A Other tear of medial meniscus, current injury, unspecified knee, initial encounter: Secondary | ICD-10-CM | POA: Diagnosis present

## 2015-10-12 MED ORDER — VANCOMYCIN HCL 10 G IV SOLR
1500.0000 mg | INTRAVENOUS | Status: AC
  Administered 2015-10-13: 1500 mg via INTRAVENOUS
  Filled 2015-10-12: qty 1500

## 2015-10-12 NOTE — H&P (Signed)
  CC- Michael Annye EnglishWilliams Jr. is a 60 y.o. male who presents with Left knee pain.  HPI- . Knee Pain: Patient presents with knee pain involving the  left knee. Onset of the symptoms was several months ago. Inciting event: none known. Current symptoms include giving out, pain located medially and swelling. Pain is aggravated by lateral movements, pivoting, rising after sitting and squatting.  Patient has had no prior knee problems. Evaluation to date: MRI with medial meniscal tear Treatment to date: corticosteroid injection which was not very effective.  Past Medical History  Diagnosis Date  . Anal fistula   . Hyperlipidemia   . GERD (gastroesophageal reflux disease)   . Wears contact lenses   . Arthritis   . At risk for sleep apnea     STOP-BANG = 5     SENT TO PCP 12-16-2013    Past Surgical History  Procedure Laterality Date  . Evaluation under anesthesia with anal fistulectomy N/A 12/19/2013    Procedure: EXAM UNDER ANESTHESIA , ANAL FISTULOTOMY;  Surgeon: Romie LeveeAlicia Thomas, MD;  [facility] [place];  Service: General;  Laterality: N/A;  . Placement of seton N/A 12/19/2013    Procedure:  PLACEMENT OF SETON;  Surgeon: Romie LeveeAlicia Thomas, MD;  [facility] [place];  Service: General;  Laterality: N/A;  . Anal fistulectomy N/A 07/01/2014    Procedure: LIGATION OF INTERNAL FISTULA TRACT;  Surgeon: Romie LeveeAlicia Thomas, MD;  [facility] [place];  Service: General;  Laterality: N/A;  . Nasal sinus surgery  2005     Prior to Admission medications   Medication Sig Start Date End Date Taking? Authorizing Provider  aspirin 81 MG tablet Take 81 mg by mouth daily.   Yes Historical Provider, MD  cetirizine (ZYRTEC) 10 MG tablet Take 10 mg by mouth daily.   Yes Historical Provider, MD  diclofenac sodium (VOLTAREN) 1 % GEL Apply 2 g topically 4 (four) times daily.   Yes Historical Provider, MD  EPINEPHrine 0.3 mg/0.3 mL IJ SOAJ injection Inject 0.3 mg into the muscle  once.   Yes Historical Provider, MD  Multiple Vitamin (MULTIVITAMIN WITH MINERALS) TABS tablet Take 1 tablet by mouth daily.   Yes Historical Provider, MD  Multiple Vitamins-Minerals (MULTIVITAMIN WITH MINERALS) tablet Take 1 tablet by mouth daily. gnc mega men 50   Yes Historical Provider, MD  pravastatin (PRAVACHOL) 20 MG tablet Take 20 mg by mouth every evening.  12/16/13  Yes Historical Provider, MD   KNEE EXAM antalgic gait, soft tissue tenderness over medial joint line, effusion, negative pivot-shift, collateral ligaments intact  Physical Examination: General appearance - alert, well appearing, and in no distress Mental status - alert, oriented to person, place, and time Chest - clear to auscultation, no wheezes, rales or rhonchi, symmetric air entry Heart - normal rate, regular rhythm, normal S1, S2, no murmurs, rubs, clicks or gallops Abdomen - soft, nontender, nondistended, no masses or organomegaly Neurological - alert, oriented, normal speech, no focal findings or movement disorder noted   Asessment/Plan--- Left knee medial meniscal tear- - Plan left knee arthroscopy with meniscal debridement. Procedure risks and potential comps discussed with patient who elects to proceed. Goals are decreased pain and increased function with a high likelihood of achieving both

## 2015-10-13 ENCOUNTER — Ambulatory Visit (HOSPITAL_COMMUNITY): Payer: BLUE CROSS/BLUE SHIELD | Admitting: Certified Registered Nurse Anesthetist

## 2015-10-13 ENCOUNTER — Encounter (HOSPITAL_COMMUNITY): Payer: Self-pay | Admitting: *Deleted

## 2015-10-13 ENCOUNTER — Encounter (HOSPITAL_COMMUNITY)
Admission: RE | Disposition: A | Discharge status: Home / Self Care | Payer: Self-pay | Source: Ambulatory Visit | Attending: Orthopedic Surgery

## 2015-10-13 ENCOUNTER — Ambulatory Visit (HOSPITAL_COMMUNITY)
Admission: RE | Admit: 2015-10-13 | Discharge: 2015-10-13 | Disposition: A | Discharge status: Home / Self Care | Payer: BLUE CROSS/BLUE SHIELD | Source: Ambulatory Visit | Attending: Orthopedic Surgery | Admitting: Orthopedic Surgery

## 2015-10-13 DIAGNOSIS — Z6832 Body mass index (BMI) 32.0-32.9, adult: Secondary | ICD-10-CM | POA: Insufficient documentation

## 2015-10-13 DIAGNOSIS — K219 Gastro-esophageal reflux disease without esophagitis: Secondary | ICD-10-CM | POA: Diagnosis not present

## 2015-10-13 DIAGNOSIS — M1990 Unspecified osteoarthritis, unspecified site: Secondary | ICD-10-CM | POA: Insufficient documentation

## 2015-10-13 DIAGNOSIS — X58XXXA Exposure to other specified factors, initial encounter: Secondary | ICD-10-CM | POA: Diagnosis not present

## 2015-10-13 DIAGNOSIS — E785 Hyperlipidemia, unspecified: Secondary | ICD-10-CM | POA: Insufficient documentation

## 2015-10-13 DIAGNOSIS — Z87891 Personal history of nicotine dependence: Secondary | ICD-10-CM | POA: Diagnosis not present

## 2015-10-13 DIAGNOSIS — Z79899 Other long term (current) drug therapy: Secondary | ICD-10-CM | POA: Insufficient documentation

## 2015-10-13 DIAGNOSIS — Z7982 Long term (current) use of aspirin: Secondary | ICD-10-CM | POA: Diagnosis not present

## 2015-10-13 DIAGNOSIS — E669 Obesity, unspecified: Secondary | ICD-10-CM | POA: Diagnosis not present

## 2015-10-13 DIAGNOSIS — S83242A Other tear of medial meniscus, current injury, left knee, initial encounter: Secondary | ICD-10-CM | POA: Diagnosis not present

## 2015-10-13 DIAGNOSIS — Z791 Long term (current) use of non-steroidal anti-inflammatories (NSAID): Secondary | ICD-10-CM | POA: Diagnosis not present

## 2015-10-13 DIAGNOSIS — S83249A Other tear of medial meniscus, current injury, unspecified knee, initial encounter: Secondary | ICD-10-CM | POA: Diagnosis present

## 2015-10-13 HISTORY — PX: KNEE ARTHROSCOPY: SHX127

## 2015-10-13 LAB — CBC
HCT: 43.2 % (ref 39.0–52.0)
Hemoglobin: 14.2 g/dL (ref 13.0–17.0)
MCH: 28.6 pg (ref 26.0–34.0)
MCHC: 32.9 g/dL (ref 30.0–36.0)
MCV: 86.9 fL (ref 78.0–100.0)
Platelets: 202 10*3/uL (ref 150–400)
RBC: 4.97 MIL/uL (ref 4.22–5.81)
RDW: 13.4 % (ref 11.5–15.5)
WBC: 6 10*3/uL (ref 4.0–10.5)

## 2015-10-13 SURGERY — ARTHROSCOPY, KNEE
Anesthesia: General | Site: Knee | Laterality: Left

## 2015-10-13 MED ORDER — ONDANSETRON HCL 4 MG/2ML IJ SOLN
INTRAMUSCULAR | Status: AC
  Filled 2015-10-13: qty 2

## 2015-10-13 MED ORDER — PROPOFOL 10 MG/ML IV BOLUS
INTRAVENOUS | Status: DC | PRN
  Administered 2015-10-13: 250 mg via INTRAVENOUS

## 2015-10-13 MED ORDER — ACETAMINOPHEN 10 MG/ML IV SOLN
1000.0000 mg | Freq: Once | INTRAVENOUS | Status: AC
  Administered 2015-10-13: 1000 mg via INTRAVENOUS

## 2015-10-13 MED ORDER — LACTATED RINGERS IV SOLN
INTRAVENOUS | Status: DC
  Administered 2015-10-13: 09:00:00 via INTRAVENOUS

## 2015-10-13 MED ORDER — ONDANSETRON HCL 4 MG/2ML IJ SOLN
INTRAMUSCULAR | Status: DC | PRN
  Administered 2015-10-13: 4 mg via INTRAVENOUS

## 2015-10-13 MED ORDER — OXYCODONE HCL 5 MG PO TABS
5.0000 mg | ORAL_TABLET | ORAL | Status: DC | PRN
  Administered 2015-10-13: 5 mg via ORAL
  Filled 2015-10-13: qty 1

## 2015-10-13 MED ORDER — ACETAMINOPHEN 10 MG/ML IV SOLN
INTRAVENOUS | Status: AC
  Filled 2015-10-13: qty 100

## 2015-10-13 MED ORDER — FENTANYL CITRATE (PF) 250 MCG/5ML IJ SOLN
INTRAMUSCULAR | Status: AC
  Filled 2015-10-13: qty 5

## 2015-10-13 MED ORDER — METHOCARBAMOL 500 MG PO TABS
ORAL_TABLET | ORAL | Status: AC
  Administered 2015-10-13: 500 mg
  Filled 2015-10-13: qty 1

## 2015-10-13 MED ORDER — PROPOFOL 10 MG/ML IV BOLUS
INTRAVENOUS | Status: AC
  Filled 2015-10-13: qty 40

## 2015-10-13 MED ORDER — FENTANYL CITRATE (PF) 100 MCG/2ML IJ SOLN
25.0000 ug | INTRAMUSCULAR | Status: DC | PRN
  Administered 2015-10-13 (×2): 50 ug via INTRAVENOUS

## 2015-10-13 MED ORDER — BUPIVACAINE-EPINEPHRINE 0.25% -1:200000 IJ SOLN
INTRAMUSCULAR | Status: DC | PRN
  Administered 2015-10-13: 20 mL

## 2015-10-13 MED ORDER — HYDROCODONE-ACETAMINOPHEN 5-325 MG PO TABS: 1.0000 | ORAL_TABLET | ORAL | Status: DC | PRN

## 2015-10-13 MED ORDER — LACTATED RINGERS IR SOLN
Status: DC | PRN
  Administered 2015-10-13: 9000 mL

## 2015-10-13 MED ORDER — LIDOCAINE HCL (CARDIAC) 20 MG/ML IV SOLN
INTRAVENOUS | Status: AC
  Filled 2015-10-13: qty 5

## 2015-10-13 MED ORDER — MIDAZOLAM HCL 5 MG/5ML IJ SOLN
INTRAMUSCULAR | Status: DC | PRN
  Administered 2015-10-13: 2 mg via INTRAVENOUS

## 2015-10-13 MED ORDER — MIDAZOLAM HCL 2 MG/2ML IJ SOLN
INTRAMUSCULAR | Status: AC
  Filled 2015-10-13: qty 2

## 2015-10-13 MED ORDER — CHLORHEXIDINE GLUCONATE 4 % EX LIQD: 60.0000 mL | Freq: Once | CUTANEOUS | Status: DC

## 2015-10-13 MED ORDER — DEXAMETHASONE SODIUM PHOSPHATE 10 MG/ML IJ SOLN
10.0000 mg | Freq: Once | INTRAMUSCULAR | Status: AC
  Administered 2015-10-13: 10 mg via INTRAVENOUS

## 2015-10-13 MED ORDER — OXYCODONE HCL 5 MG PO TABS: 5.0000 mg | ORAL_TABLET | ORAL | Status: DC | PRN

## 2015-10-13 MED ORDER — METHOCARBAMOL 500 MG PO TABS: 500.0000 mg | ORAL_TABLET | Freq: Four times a day (QID) | ORAL | Status: DC

## 2015-10-13 MED ORDER — OXYCODONE-ACETAMINOPHEN 5-325 MG PO TABS: 1.0000 | ORAL_TABLET | Freq: Once | ORAL | Status: DC

## 2015-10-13 MED ORDER — PROMETHAZINE HCL 25 MG/ML IJ SOLN: 6.2500 mg | INTRAMUSCULAR | Status: DC | PRN

## 2015-10-13 MED ORDER — BUPIVACAINE-EPINEPHRINE (PF) 0.25% -1:200000 IJ SOLN
INTRAMUSCULAR | Status: AC
  Filled 2015-10-13: qty 30

## 2015-10-13 MED ORDER — FENTANYL CITRATE (PF) 100 MCG/2ML IJ SOLN
INTRAMUSCULAR | Status: AC
  Filled 2015-10-13: qty 2

## 2015-10-13 MED ORDER — FENTANYL CITRATE (PF) 100 MCG/2ML IJ SOLN
INTRAMUSCULAR | Status: DC | PRN
  Administered 2015-10-13 (×5): 50 ug via INTRAVENOUS

## 2015-10-13 MED ORDER — KETOROLAC TROMETHAMINE 30 MG/ML IJ SOLN
INTRAMUSCULAR | Status: DC | PRN
  Administered 2015-10-13: 30 mg via INTRAVENOUS

## 2015-10-13 MED ORDER — KETOROLAC TROMETHAMINE 30 MG/ML IJ SOLN
INTRAMUSCULAR | Status: AC
  Filled 2015-10-13: qty 1

## 2015-10-13 MED ORDER — SODIUM CHLORIDE 0.9 % IV SOLN: INTRAVENOUS | Status: DC

## 2015-10-13 MED ORDER — LIDOCAINE HCL (CARDIAC) 20 MG/ML IV SOLN
INTRAVENOUS | Status: DC | PRN
  Administered 2015-10-13: 100 mg via INTRAVENOUS

## 2015-10-13 SURGICAL SUPPLY — 26 items
BANDAGE ACE 6X5 VEL STRL LF (GAUZE/BANDAGES/DRESSINGS) ×3 IMPLANT
BLADE 4.2CUDA (BLADE) ×3 IMPLANT
COVER SURGICAL LIGHT HANDLE (MISCELLANEOUS) ×3 IMPLANT
CUFF TOURN SGL QUICK 34 (TOURNIQUET CUFF) ×2
CUFF TRNQT CYL 34X4X40X1 (TOURNIQUET CUFF) ×1 IMPLANT
DRAPE U-SHAPE 47X51 STRL (DRAPES) ×3 IMPLANT
DRSG EMULSION OIL 3X3 NADH (GAUZE/BANDAGES/DRESSINGS) ×3 IMPLANT
DRSG PAD ABDOMINAL 8X10 ST (GAUZE/BANDAGES/DRESSINGS) ×3 IMPLANT
DURAPREP 26ML APPLICATOR (WOUND CARE) ×3 IMPLANT
GAUZE SPONGE 4X4 12PLY STRL (GAUZE/BANDAGES/DRESSINGS) ×3 IMPLANT
GLOVE BIO SURGEON STRL SZ8 (GLOVE) ×3 IMPLANT
GLOVE BIOGEL PI IND STRL 8 (GLOVE) ×1 IMPLANT
GLOVE BIOGEL PI INDICATOR 8 (GLOVE) ×2
GOWN STRL REUS W/TWL LRG LVL3 (GOWN DISPOSABLE) ×3 IMPLANT
KIT BASIN OR (CUSTOM PROCEDURE TRAY) ×3 IMPLANT
MANIFOLD NEPTUNE II (INSTRUMENTS) ×3 IMPLANT
MARKER SKIN DUAL TIP RULER LAB (MISCELLANEOUS) ×3 IMPLANT
PACK ARTHROSCOPY WL (CUSTOM PROCEDURE TRAY) ×3 IMPLANT
PACK ICE MAXI GEL EZY WRAP (MISCELLANEOUS) ×9 IMPLANT
PADDING CAST COTTON 6X4 STRL (CAST SUPPLIES) ×3 IMPLANT
POSITIONER SURGICAL ARM (MISCELLANEOUS) ×3 IMPLANT
SUT ETHILON 4 0 PS 2 18 (SUTURE) ×3 IMPLANT
TOWEL OR 17X26 10 PK STRL BLUE (TOWEL DISPOSABLE) ×3 IMPLANT
TUBING ARTHRO INFLOW-ONLY STRL (TUBING) ×3 IMPLANT
WAND HAND CNTRL MULTIVAC 90 (MISCELLANEOUS) ×3 IMPLANT
WRAP KNEE MAXI GEL POST OP (GAUZE/BANDAGES/DRESSINGS) ×3 IMPLANT

## 2015-10-13 NOTE — Discharge Instructions (Signed)
° °Dr. Frank Aluisio °Total [facility] Specialist °[facility] °[address] °[place], [place] [address] °[phone] ° ° °Arthroscopic Procedure, Knee °An arthroscopic procedure can find what is wrong with your knee. °PROCEDURE °Arthroscopy is a surgical technique that allows your orthopedic surgeon to diagnose and treat your knee injury with accuracy. They will look into your knee through a small instrument. This is almost like a small (pencil sized) telescope. Because arthroscopy affects your knee less than open knee surgery, you can anticipate a more rapid recovery. Taking an active role by following your caregiver's instructions will help with rapid and complete recovery. Use crutches, rest, elevation, ice, and knee exercises as instructed. The length of recovery depends on various factors including type of injury, age, physical condition, medical conditions, and your rehabilitation. °Your knee is the joint between the large bones (femur and tibia) in your leg. Cartilage covers these bone ends which are smooth and slippery and allow your knee to bend and move smoothly. Two menisci, thick, semi-lunar shaped pads of cartilage which form a rim inside the joint, help absorb shock and stabilize your knee. Ligaments bind the bones together and support your knee joint. Muscles move the joint, help support your knee, and take stress off the joint itself. Because of this all programs and physical therapy to rehabilitate an injured or repaired knee require rebuilding and strengthening your muscles. °AFTER THE PROCEDURE °· After the procedure, you will be moved to a recovery area until most of the effects of the medication have worn off. Your caregiver will discuss the test results with you.  °· Only take over-the-counter or prescription medicines for pain, discomfort, or fever as directed by your caregiver.  °SEEK MEDICAL CARE IF:  °· You have increased bleeding from your wounds.  °· You see  redness, swelling, or have increasing pain in your wounds.  °· You have pus coming from your wound.  °· You have an oral temperature above 102° F (38.9° C).  °· You notice a bad smell coming from the wound or dressing.  °· You have severe pain with any motion of your knee.  °SEEK IMMEDIATE MEDICAL CARE IF:  °· You develop a rash.  °· You have difficulty breathing.  °· You have any allergic problems.  °FURTHER INSTRUCTIONS:  °· ICE to the affected knee every three hours for 30 minutes at a time and then as needed for pain and swelling.  Continue to use ice on the knee for pain and swelling from surgery. You may notice swelling that will progress down to the foot and ankle.  This is normal after surgery.  Elevate the leg when you are not up walking on it.   ° °DIET °You may resume your previous home diet once your are discharged from the hospital. ° °DRESSING / WOUND CARE / SHOWERING °You may start showering two days after being discharged home but do not submerge the incisions under water.  °Change dressing 48 hours after the procedure and then cover the small incisions with band aids until your follow up visit. °Change the surgical dressings daily and reapply a dry dressing each time.  ° °ACTIVITY °Walk with your walker as instructed. °Use walker as long as suggested by your caregivers. °Avoid periods of inactivity such as sitting longer than an hour when not asleep. This helps prevent blood clots.  °You may resume a sexual relationship in one month or when given the OK by your doctor.  °You may return to   work once you are cleared by your doctor.  °Do not drive a car for 6 weeks or until released by you surgeon.  °Do not drive while taking narcotics. ° °WEIGHT BEARING AS TOLERATED ° °POSTOPERATIVE CONSTIPATION PROTOCOL °Constipation - defined medically as fewer than three stools per week and severe constipation as less than one stool per week. ° °One of the most common issues patients have following surgery is  constipation.  Even if you have a regular bowel pattern at home, your normal regimen is likely to be disrupted due to multiple reasons following surgery.  Combination of anesthesia, postoperative narcotics, change in appetite and fluid intake all can affect your bowels.  In order to avoid complications following surgery, here are some recommendations in order to help you during your recovery period. ° °Colace (docusate) - Pick up an over-the-counter form of Colace or another stool softener and take twice a day as long as you are requiring postoperative pain medications.  Take with a full glass of water daily.  If you experience loose stools or diarrhea, hold the colace until you stool forms back up.  If your symptoms do not get better within 1 week or if they get worse, check with your doctor. ° °Dulcolax (bisacodyl) - Pick up over-the-counter and take as directed by the product packaging as needed to assist with the movement of your bowels.  Take with a full glass of water.  Use this product as needed if not relieved by Colace only.  ° °MiraLax (polyethylene glycol) - Pick up over-the-counter to have on hand.  MiraLax is a solution that will increase the amount of water in your bowels to assist with bowel movements.  Take as directed and can mix with a glass of water, juice, soda, coffee, or tea.  Take if you go more than two days without a movement. °Do not use MiraLax more than once per day. Call your doctor if you are still constipated or irregular after using this medication for 7 days in a row. ° °If you continue to have problems with postoperative constipation, please contact the office for further assistance and recommendations.  If you experience "the worst abdominal pain ever" or develop nausea or vomiting, please contact the office immediatly for further recommendations for treatment. ° °ITCHING ° If you experience itching with your medications, try taking only a single pain pill, or even half a pain pill  at a time.  You can also use Benadryl over the counter for itching or also to help with sleep.  ° °TED HOSE STOCKINGS °Wear the elastic stockings on both legs for three weeks following surgery during the day but you may remove then at night for sleeping. ° °MEDICATIONS °See your medication summary on the “After Visit Summary” that the nursing staff will review with you prior to discharge.  You may have some home medications which will be placed on hold until you complete the course of blood thinner medication.  It is important for you to complete the blood thinner medication as prescribed by your surgeon.  Continue your approved medications as instructed at time of discharge. °Do not drive while taking narcotics.  ° °PRECAUTIONS °If you experience chest pain or shortness of breath - call 911 immediately for transfer to the hospital emergency department.  °If you develop a fever greater that 101 F, purulent drainage from wound, increased redness or drainage from wound, foul odor from the wound/dressing, or calf pain - CONTACT YOUR SURGEON.   °                                                °  FOLLOW-UP APPOINTMENTS °Make sure you keep all of your appointments after your operation with your surgeon and caregivers. You should call the office at [phone]  and make an appointment for approximately one week after the date of your surgery or on the date instructed by your surgeon outlined in the "After Visit Summary". ° °RANGE OF MOTION AND STRENGTHENING EXERCISES  °Rehabilitation of the knee is important following a knee injury or an operation. After just a few days of immobilization, the muscles of the thigh which control the knee become weakened and shrink (atrophy). Knee exercises are designed to build up the tone and strength of the thigh muscles and to improve knee motion. Often times heat used for twenty to thirty minutes before working out will loosen up your tissues and help with improving the range of motion  but do not use heat for the first two weeks following surgery. These exercises can be done on a training (exercise) mat, on the floor, on a table or on a bed. Use what ever works the best and is most comfortable for you Knee exercises include: ° °QUAD STRENGTHENING EXERCISES °Strengthening Quadriceps Sets ° °Tighten muscles on top of thigh by pushing knees down into floor or table. °Hold for 20 seconds. Repeat 10 times. °Do 2 sessions per day. ° ° ° ° °Strengthening Terminal Knee Extension ° °With knee bent over bolster, straighten knee by tightening muscle on top of thigh. Be sure to keep bottom of knee on bolster. °Hold for 20 seconds. Repeat 10 times. °Do 2 sessions per day. ° ° °Straight Leg with Bent Knee ° °Lie on back with opposite leg bent. Keep involved knee slightly bent at knee and raise leg 4-6". Hold for 10 seconds. °Repeat 20 times per set. °Do 2 sets per session. °Do 2 sessions per day. ° ° ° ° °General Anesthesia, Adult, Care After °Refer to this sheet in the next few weeks. These instructions provide you with information on caring for yourself after your procedure. Your health care provider may also give you more specific instructions. Your treatment has been planned according to current medical practices, but problems sometimes occur. Call your health care provider if you have any problems or questions after your procedure. °WHAT TO EXPECT AFTER THE PROCEDURE °After the procedure, it is typical to experience: °· Sleepiness. °· Nausea and vomiting. °HOME CARE INSTRUCTIONS °· For the first 24 hours after general anesthesia: °¨ Have a responsible person with you. °¨ Do not drive a car. If you are alone, do not take public transportation. °¨ Do not drink alcohol. °¨ Do not take medicine that has not been prescribed by your health care provider. °¨ Do not sign important papers or make important decisions. °¨ You may resume a normal diet and activities as directed by your health care provider. °· Change  bandages (dressings) as directed. °· If you have questions or problems that seem related to general anesthesia, call the hospital and ask for the anesthetist or anesthesiologist on call. °SEEK MEDICAL CARE IF: °· You have nausea and vomiting that continue the day after anesthesia. °· You develop a rash. °SEEK IMMEDIATE MEDICAL CARE IF:  °· You have difficulty breathing. °· You have chest pain. °· You have any allergic problems. °  °This information is not intended to replace advice given to you by your health care provider. Make sure you discuss any questions you have with your health care provider. °  °Document Released: 10/30/2000 Document Revised: 08/14/2014 Document Reviewed:   11/22/2011 °Elsevier Interactive Patient Education ©2016 [employer] ° °

## 2015-10-13 NOTE — Progress Notes (Signed)
Pt c/o itching head and neck, also reports intolerance to Norco, notified Dr Gerri SporeAluiso. See orders.

## 2015-10-13 NOTE — Anesthesia Procedure Notes (Signed)
Procedure Name: LMA Insertion Date/Time: 10/13/2015 10:10 AM Performed by: Epimenio SarinJARVELA, Michael Winnie R Pre-anesthesia Checklist: Patient identified, Emergency Drugs available, Suction available, Patient being monitored and Timeout performed Patient Re-evaluated:Patient Re-evaluated prior to inductionOxygen Delivery Method: Circle system utilized Preoxygenation: Pre-oxygenation with 100% oxygen Intubation Type: IV induction Ventilation: Mask ventilation with difficulty LMA: LMA with gastric port inserted LMA Size: 5.0 Number of attempts: 1 Dental Injury: Teeth and Oropharynx as per pre-operative assessment

## 2015-10-13 NOTE — Anesthesia Preprocedure Evaluation (Addendum)
Anesthesia Evaluation  Patient identified by MRN, date of birth, ID band Patient awake    Reviewed: Allergy & Precautions, NPO status , Patient's Chart, lab work & pertinent test results  Airway Mallampati: II  TM Distance: <3 FB Neck ROM: Full    Dental  (+) Teeth Intact, Dental Advisory Given   Pulmonary former smoker,    Pulmonary exam normal breath sounds clear to auscultation       Cardiovascular Exercise Tolerance: Good negative cardio ROS Normal cardiovascular exam Rhythm:Regular Rate:Normal     Neuro/Psych negative neurological ROS  negative psych ROS   GI/Hepatic Neg liver ROS, GERD  Medicated and Controlled,  Endo/Other  Obesity   Renal/GU negative Renal ROS     Musculoskeletal  (+) Arthritis , Osteoarthritis,    Abdominal   Peds  Hematology negative hematology ROS (+)   Anesthesia Other Findings Day of surgery medications reviewed with the patient.  Reproductive/Obstetrics                            Anesthesia Physical Anesthesia Plan  ASA: II  Anesthesia Plan: General   Post-op Pain Management:    Induction: Intravenous  Airway Management Planned: LMA  Additional Equipment:   Intra-op Plan:   Post-operative Plan: Extubation in OR  Informed Consent: I have reviewed the patients History and Physical, chart, labs and discussed the procedure including the risks, benefits and alternatives for the proposed anesthesia with the patient or authorized representative who has indicated his/her understanding and acceptance.   Dental advisory given  Plan Discussed with: CRNA  Anesthesia Plan Comments: (Risks/benefits of general anesthesia discussed with patient including risk of damage to teeth, lips, gum, and tongue, nausea/vomiting, allergic reactions to medications, and the possibility of heart attack, stroke and death.  All patient questions answered.  Patient wishes to  proceed.)        Anesthesia Quick Evaluation

## 2015-10-13 NOTE — Interval H&P Note (Signed)
History and Physical Interval Note:  10/13/2015 8:14 AM  Michael ClickLonnie Malkowski Jr.  has presented today for surgery, with the diagnosis of LEFT KNEE MEDIAL MENSICAL TEAR   The various methods of treatment have been discussed with the patient and family. After consideration of risks, benefits and other options for treatment, the patient has consented to  Procedure(s): LEFT KNEE ARTHROSCOPY WITH DEBRIDEMENT  (Left) as a surgical intervention .  The patient's history has been reviewed, patient examined, no change in status, stable for surgery.  I have reviewed the patient's chart and labs.  Questions were answered to the patient's satisfaction.     Loanne DrillingALUISIO,Darleene Cumpian V

## 2015-10-13 NOTE — Transfer of Care (Signed)
Immediate Anesthesia Transfer of Care Note  Patient: Michael ClickLonnie Thalman Jr.  Procedure(s) Performed: Procedure(s): LEFT KNEE ARTHROSCOPY WITH Medial meninus debridement and chrondroplasty  (Left)  Patient Location: PACU  Anesthesia Type:General  Level of Consciousness:  sedated, patient cooperative and responds to stimulation  Airway & Oxygen Therapy:Patient Spontanous Breathing and Patient connected to face mask oxgen  Post-op Assessment:  Report given to PACU RN and Post -op Vital signs reviewed and stable  Post vital signs:  Reviewed and stable  Last Vitals:  Filed Vitals:   10/13/15 0810  BP: 145/88  Pulse: 64  Temp: 36.4 C  Resp: 18    Complications: No apparent anesthesia complications

## 2015-10-13 NOTE — Anesthesia Postprocedure Evaluation (Signed)
Anesthesia Post Note  Patient: Michael ClickLonnie Hauser Jr.  Procedure(s) Performed: Procedure(s) (LRB): LEFT KNEE ARTHROSCOPY WITH Medial meninus debridement and chrondroplasty  (Left)  Patient location during evaluation: PACU Anesthesia Type: General Level of consciousness: awake and alert Pain management: pain level controlled Vital Signs Assessment: post-procedure vital signs reviewed and stable Respiratory status: spontaneous breathing, nonlabored ventilation, respiratory function stable and patient connected to nasal cannula oxygen Cardiovascular status: blood pressure returned to baseline and stable Postop Assessment: no signs of nausea or vomiting Anesthetic complications: no    Last Vitals:  Filed Vitals:   10/13/15 1143 10/13/15 1235  BP: 151/86 148/90  Pulse: 67 70  Temp: 36.7 C 36.7 C  Resp: 12 14    Last Pain:  Filed Vitals:   10/13/15 1241  PainSc: 4                  Cecile HearingStephen Edward Turk

## 2015-10-13 NOTE — Op Note (Signed)
Preoperative diagnosis-  Left knee medial meniscal tear  Postoperative diagnosis Left- knee medial meniscal tear plus  Chondral defect  Procedure- Left knee arthroscopy with medial  meniscal debridement and chondroplasty   Surgeon- Gus RankinFrank V. Fortino Haag, MD  Anesthesia-General  EBL-  Minimal  Complications- None  Condition- PACU - hemodynamically stable.  Brief clinical note- -Michael Annye EnglishWilliams Jr. is a 60 y.o.  male with a several month history of left knee pain and mechanical symptoms. Exam and history suggested medial meniscal tear confirmed by MRI. The patient presents now for arthroscopy and debridement   Procedure in detail -       After successful administration of General anesthetic, a tourmiquet is placed high on the Left  thigh and the Left lower extremity is prepped and draped in the usual sterile fashion. Time out is performed by the surgical team. Standard superomedial and inferolateral portal sites are marked and incisions made with an 11 blade. The inflow cannula is passed through the superomedial portal and camera through the inferolateral portal and inflow is initiated. Arthroscopic visualization proceeds.      The undersurface of the patella and trochlea are visualized and there are Grade II changes of both surfaces but no unstable chondral defects. The medial and lateral gutters are visualized and there are  no loose bodies. Flexion and valgus force is applied to the knee and the medial compartment is entered. A spinal needle is passed into the joint through the site marked for the inferomedial portal. A small incision is made and the dilator passed into the joint. The findings for the medial compartment are unstable tear body and posterior horn of medial meniscus with 2 x 2 cm area of Grade II and III changes medial femoral condyle and scattered areas of unstable cartilage within the lesion. . The tear is debrided to a stable base with baskets and a shaver and sealed off with the  Arthrocare. The shaver is used to debride the unstable cartilage to a stable  cartilaginous base with stable edges. It is probed and found to be stable.    The intercondylar notch is visualized and the ACL appears normal. The lateral compartment is entered and the findings are normal .      The joint is again inspected and there are no other tears, defects or loose bodies identified. The arthroscopic equipment is then removed from the inferior portals which are closed with interrupted 4-0 nylon. 20 ml of .25% Marcaine with epinephrine are injected through the inflow cannula and the cannula is then removed and the portal closed with nylon. The incisions are cleaned and dried and a bulky sterile dressing is applied. The patient is then awakened and transported to recovery in stable condition.   10/13/2015, 10:48 AM

## 2015-10-13 NOTE — Progress Notes (Signed)
Robaxin given per prescription

## 2016-05-25 ENCOUNTER — Other Ambulatory Visit: Payer: Self-pay | Admitting: Family Medicine

## 2016-05-25 DIAGNOSIS — Z1231 Encounter for screening mammogram for malignant neoplasm of breast: Secondary | ICD-10-CM

## 2016-05-31 ENCOUNTER — Other Ambulatory Visit: Payer: BLUE CROSS/BLUE SHIELD

## 2017-05-23 ENCOUNTER — Other Ambulatory Visit: Payer: Self-pay | Admitting: Family Medicine

## 2017-05-23 DIAGNOSIS — R1011 Right upper quadrant pain: Secondary | ICD-10-CM

## 2017-05-31 ENCOUNTER — Ambulatory Visit
Admission: RE | Admit: 2017-05-31 | Discharge: 2017-05-31 | Disposition: A | Discharge status: Home / Self Care | Payer: BLUE CROSS/BLUE SHIELD | Source: Ambulatory Visit | Attending: Family Medicine | Admitting: Family Medicine

## 2017-05-31 DIAGNOSIS — R1011 Right upper quadrant pain: Secondary | ICD-10-CM

## 2018-03-28 ENCOUNTER — Ambulatory Visit
Admission: RE | Admit: 2018-03-28 | Discharge: 2018-03-28 | Disposition: A | Discharge status: Home / Self Care | Payer: BLUE CROSS/BLUE SHIELD | Source: Ambulatory Visit | Attending: Family Medicine | Admitting: Family Medicine

## 2018-03-28 ENCOUNTER — Other Ambulatory Visit: Payer: Self-pay | Admitting: Family Medicine

## 2018-03-28 DIAGNOSIS — G8929 Other chronic pain: Secondary | ICD-10-CM

## 2018-03-28 DIAGNOSIS — M5440 Lumbago with sciatica, unspecified side: Principal | ICD-10-CM

## 2018-04-26 IMAGING — US US ABDOMEN LIMITED
1 series · 14 of 25 positions shown · non-contrast
Comparison: None.

CLINICAL DATA: Right upper quadrant pain.

EXAM:
ULTRASOUND ABDOMEN LIMITED RIGHT UPPER QUADRANT

[Series 1: us abdomen limited · 0.26mm/px · 14 of 38 slices shown]
[im 1/38]
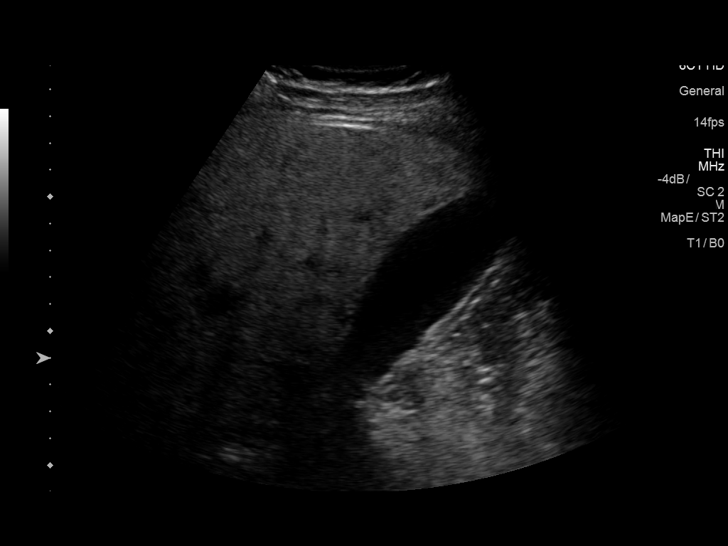
[im 4/38]
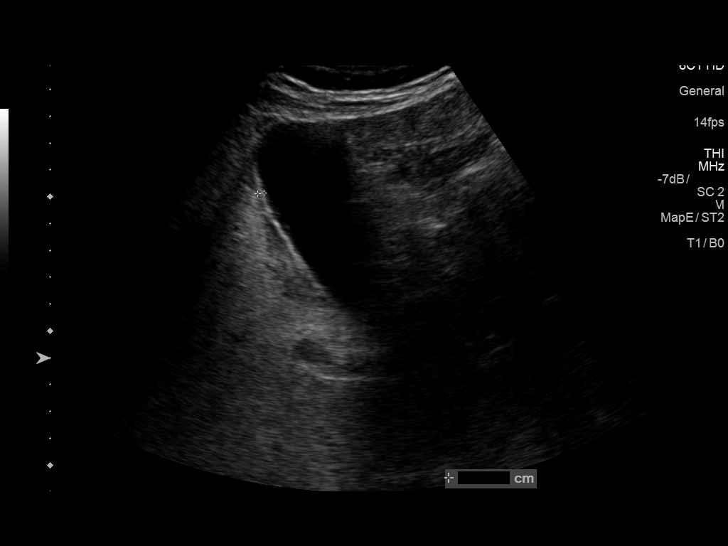
[im 7/38]
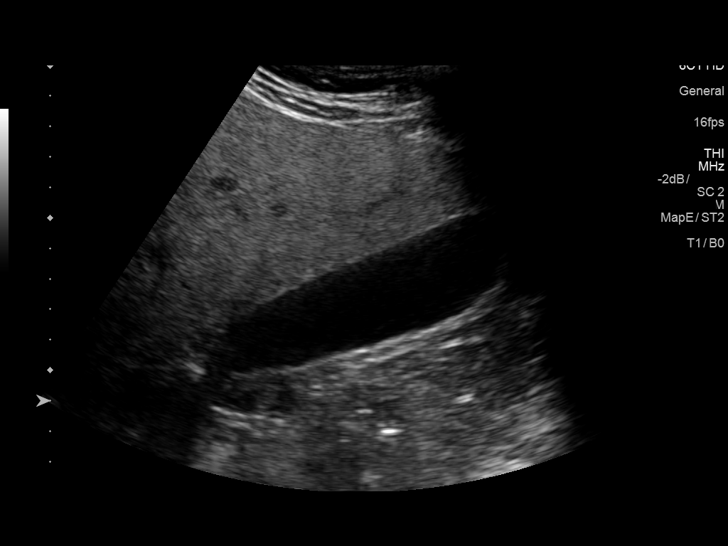
[im 10/38]
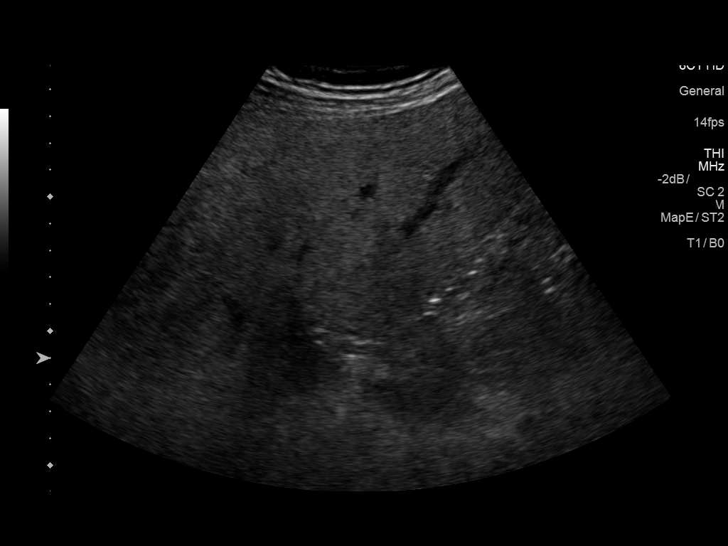
[im 13/38]
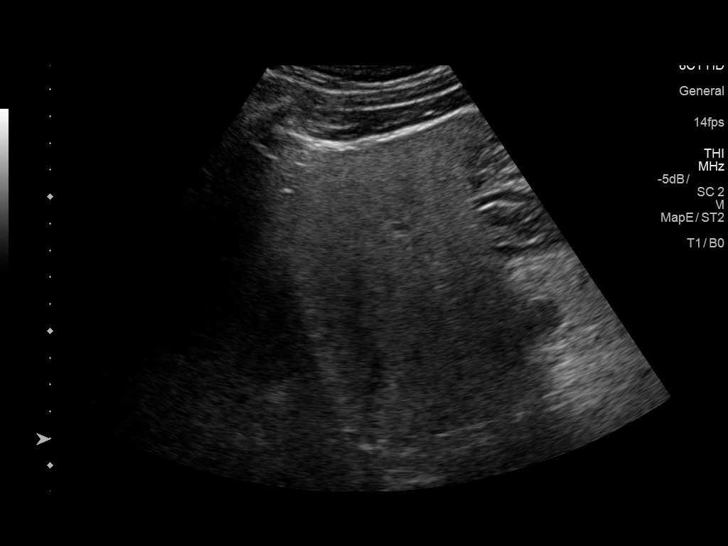
[im 14/38]
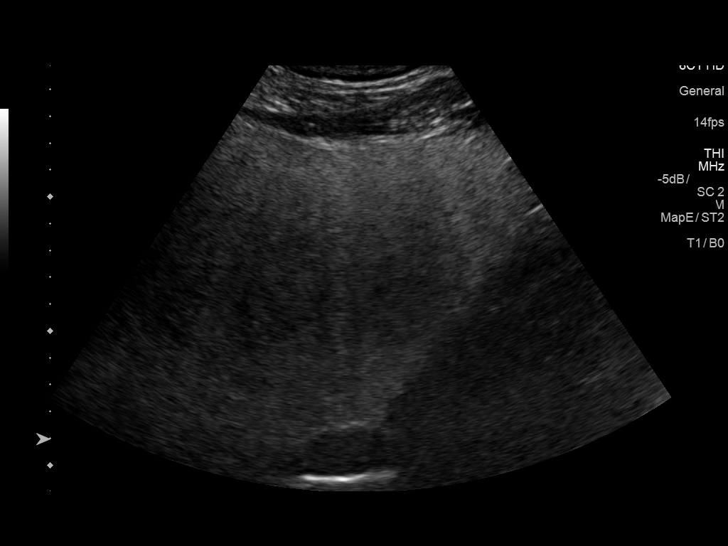
[im 17/38]
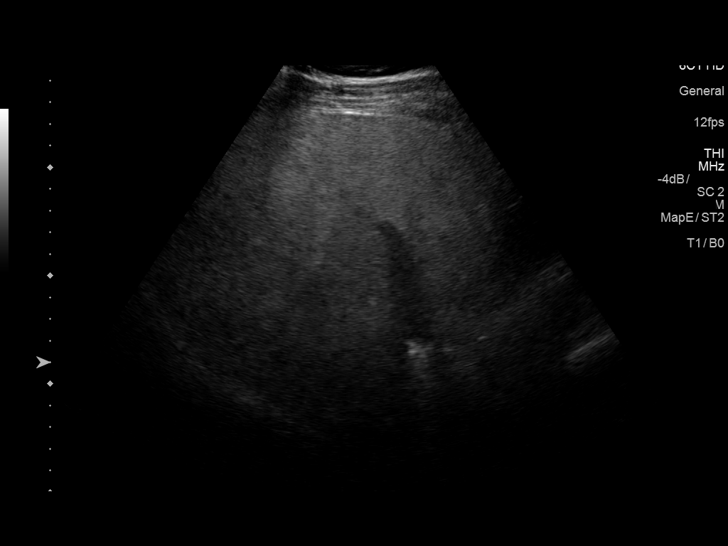
[im 21/38]
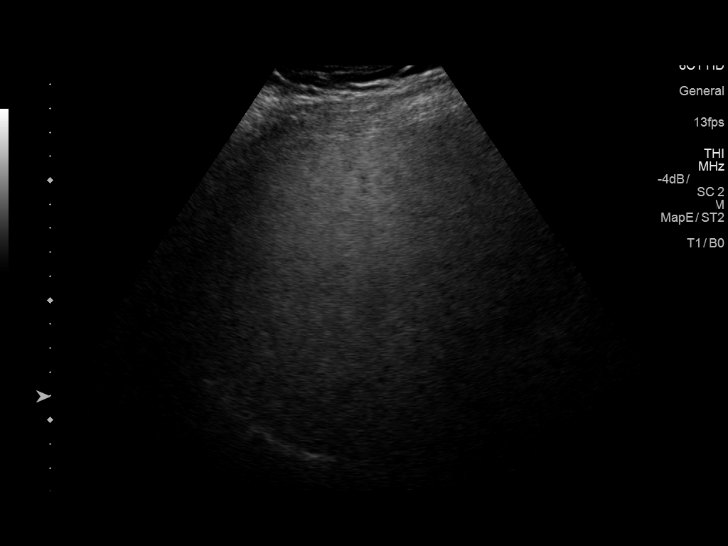
[im 24/38]
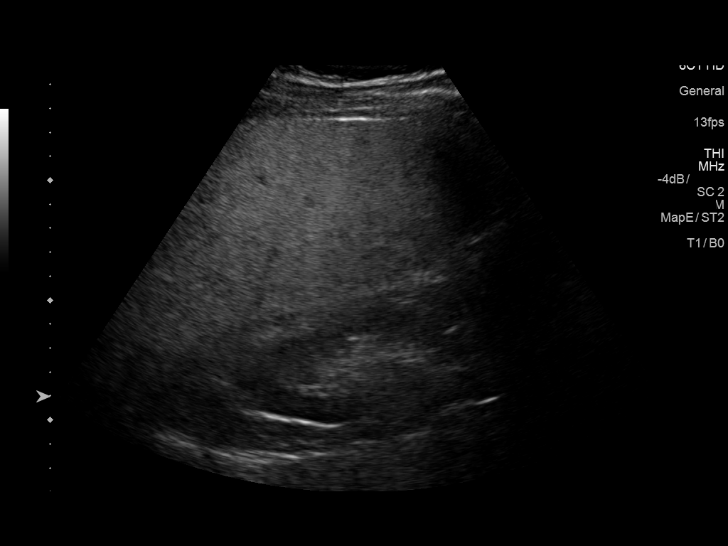
[im 25/38]
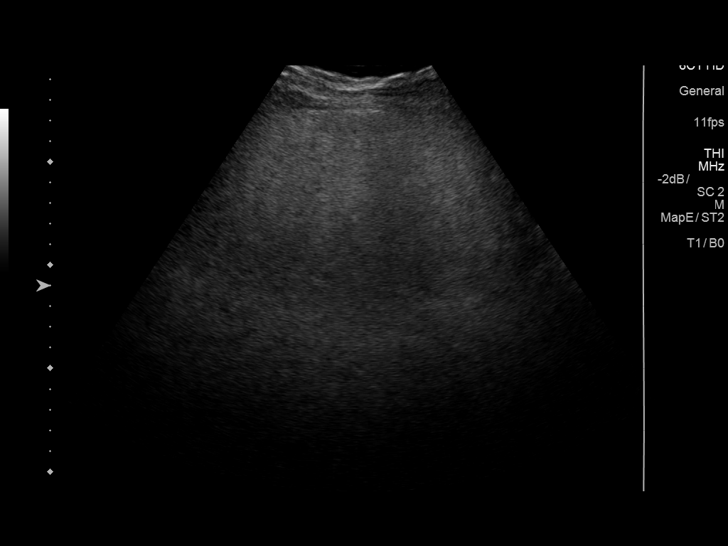
[im 28/38]
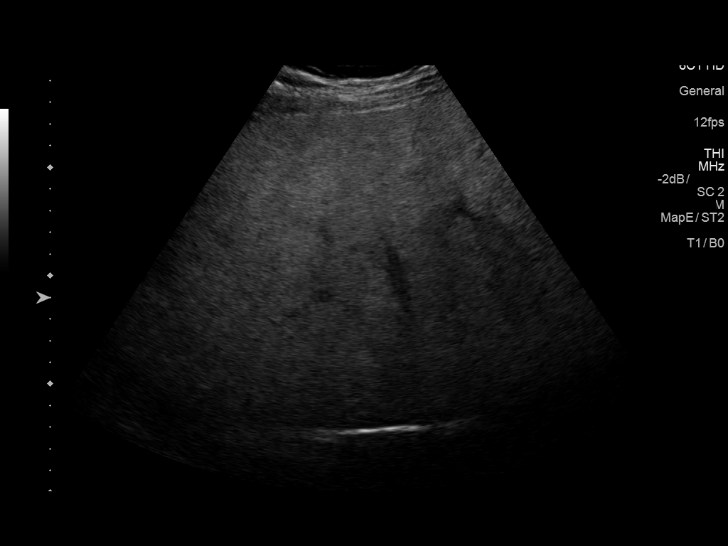
[im 31/38]
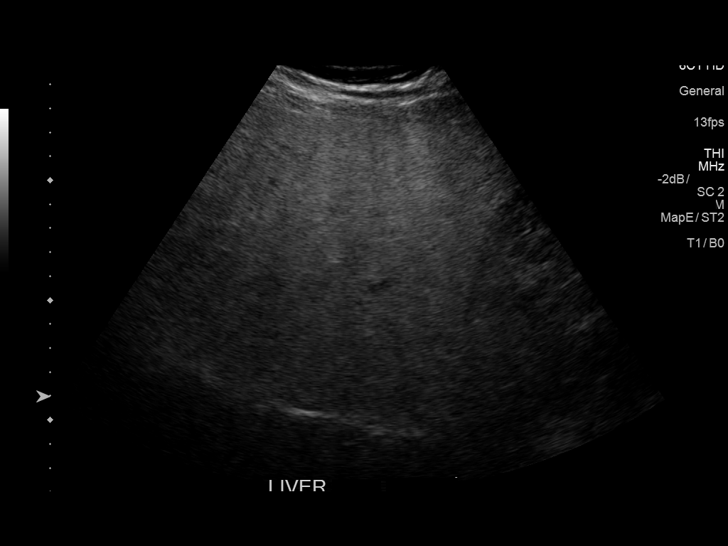
[im 34/38]
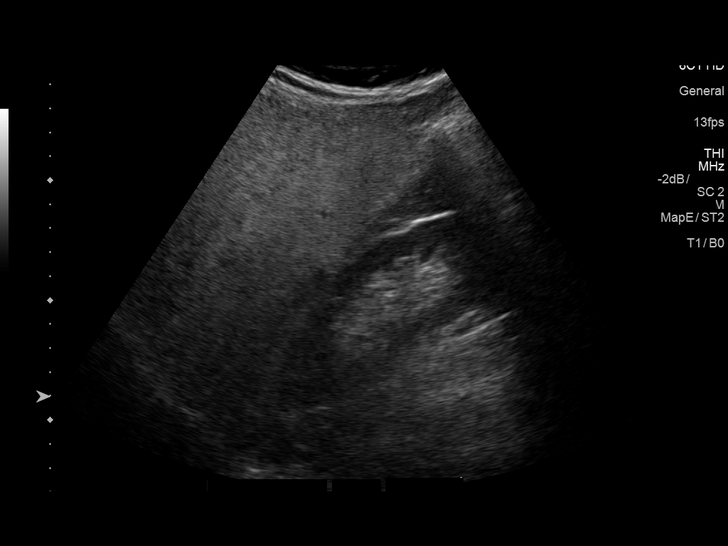
[im 38/38]
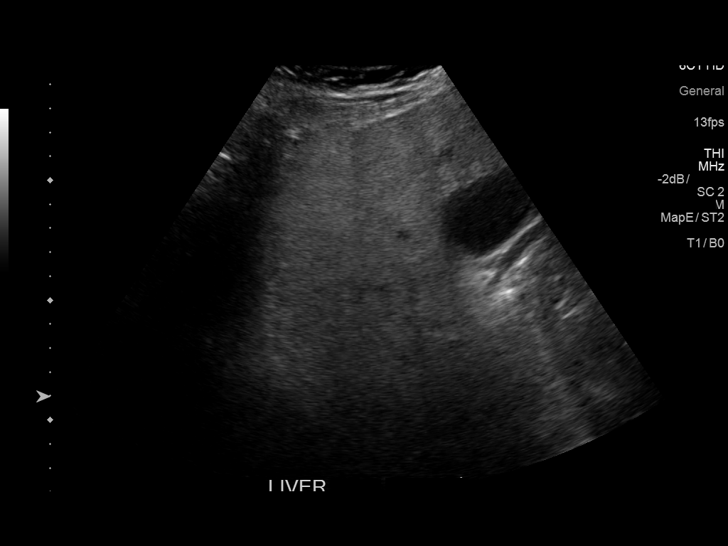

[14 of 25 positions shown; findings below may reference images not displayed]

FINDINGS: Gallbladder:

No gallstones or wall thickening visualized. No sonographic Murphy
sign noted by sonographer.

Common bile duct:

Diameter: 4.8 mm

Liver:

Diffuse increased echogenicity with no focal mass. Portal vein is
patent on color Doppler imaging with normal direction of blood flow
towards the liver.
IMPRESSION: 1. Diffuse increased echogenicity in the liver is nonspecific but
often seen with hepatic steatosis. No other abnormalities
identified.

## 2019-05-24 IMAGING — DX DG LUMBAR SPINE 2-3V
3 series · 3 of 3 positions shown · non-contrast
Comparison: None.

CLINICAL DATA: Chronic lumbar pain w pain now radiating into the Rt
leg, no injury, no surg

EXAM:
LUMBAR SPINE - 2-3 VIEW

[dg lumbar spine 2-3 views (1 of 3)]
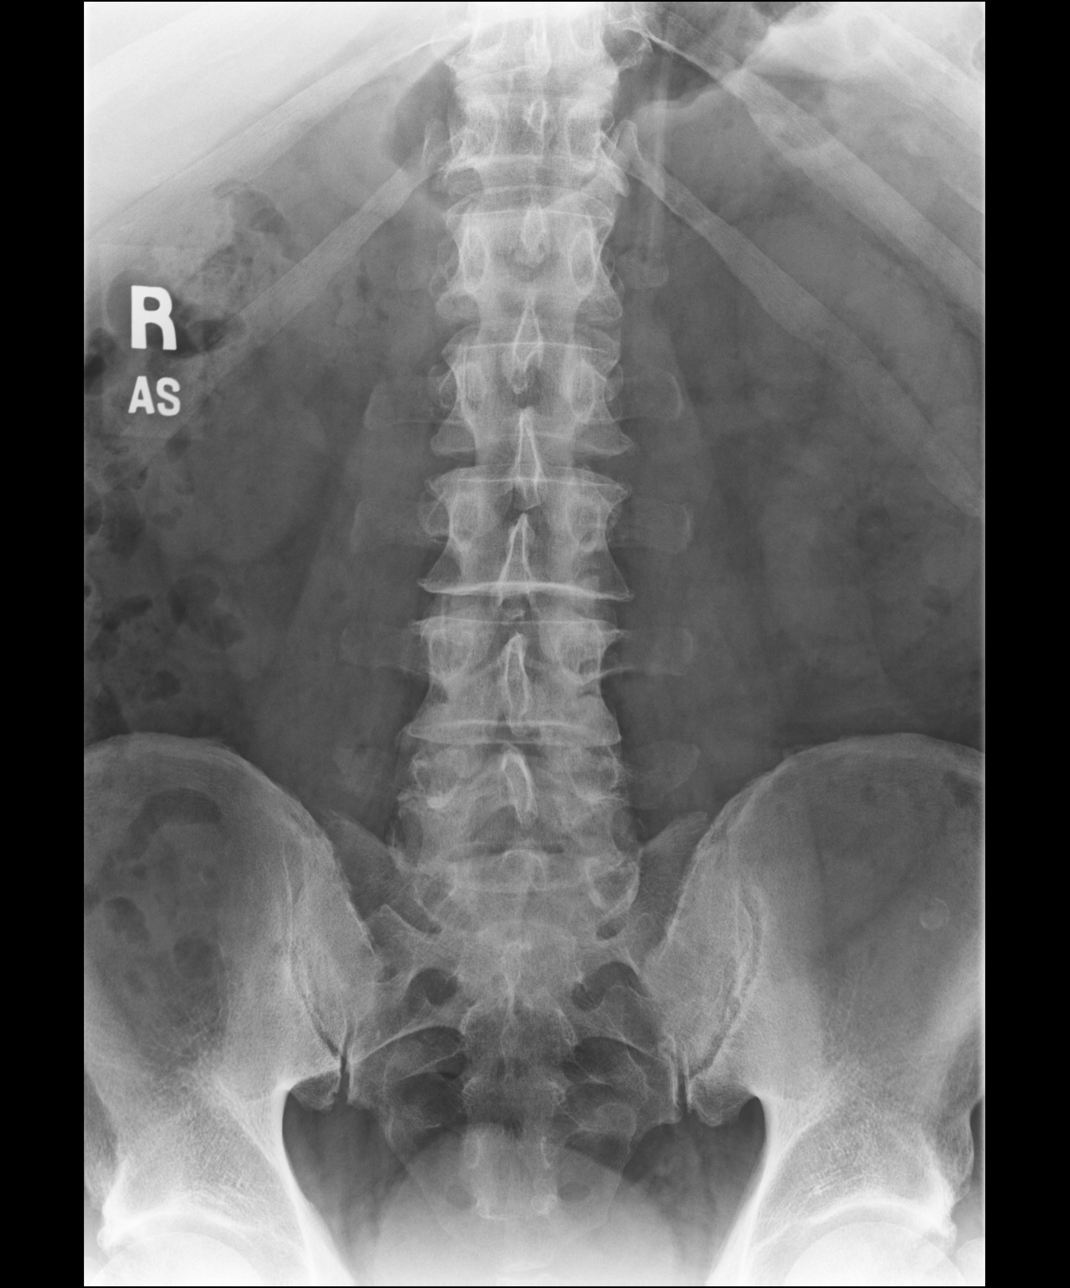

[dg lumbar spine 2-3 views (2 of 3)]
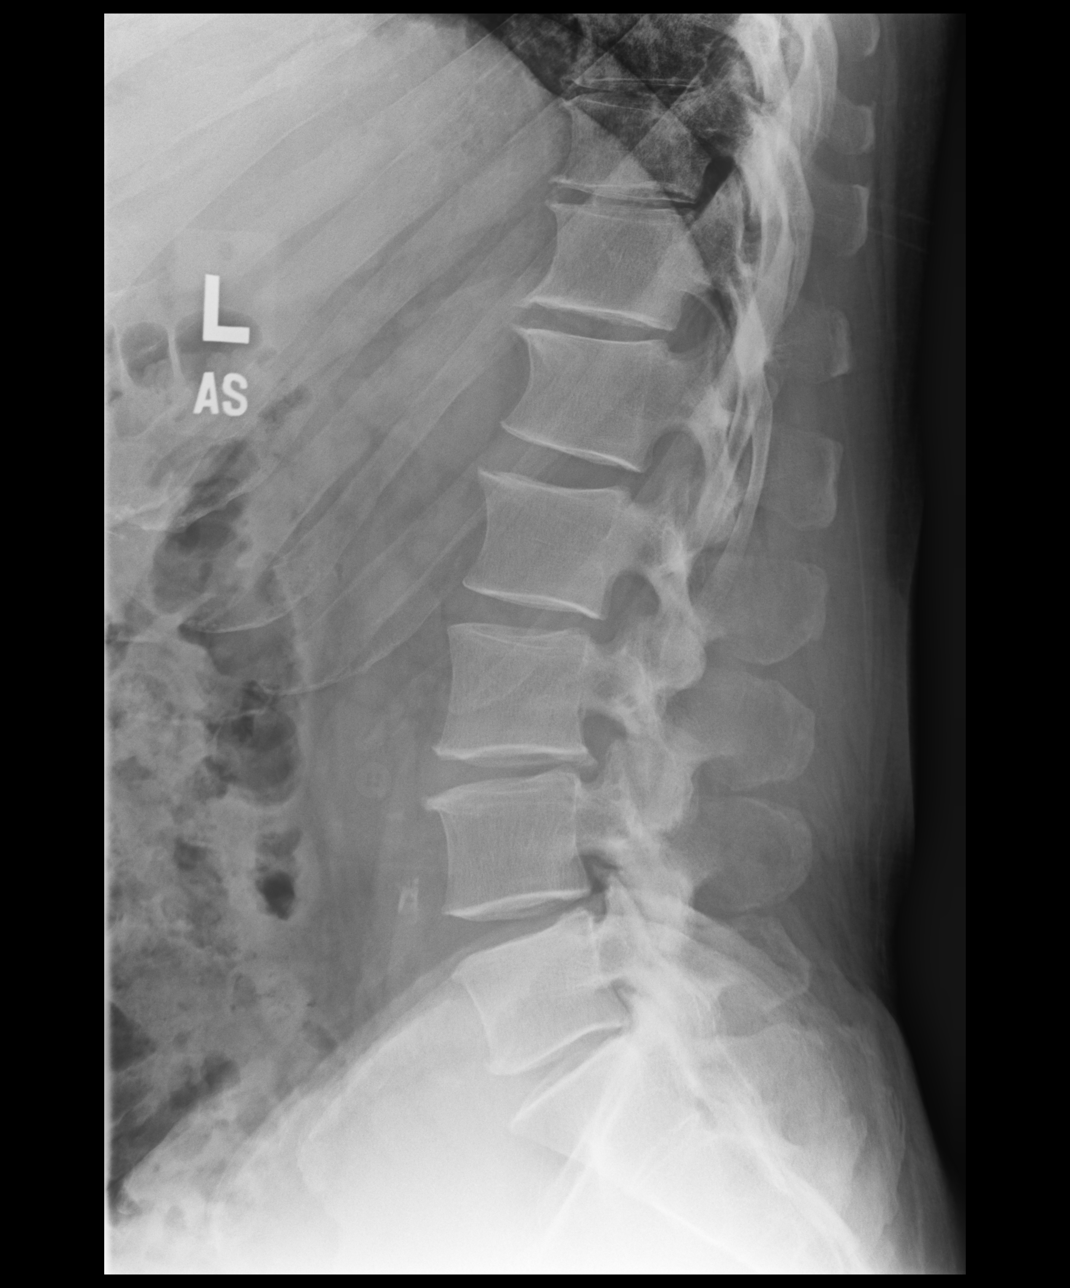

[dg lumbar spine 2-3 views (3 of 3)]
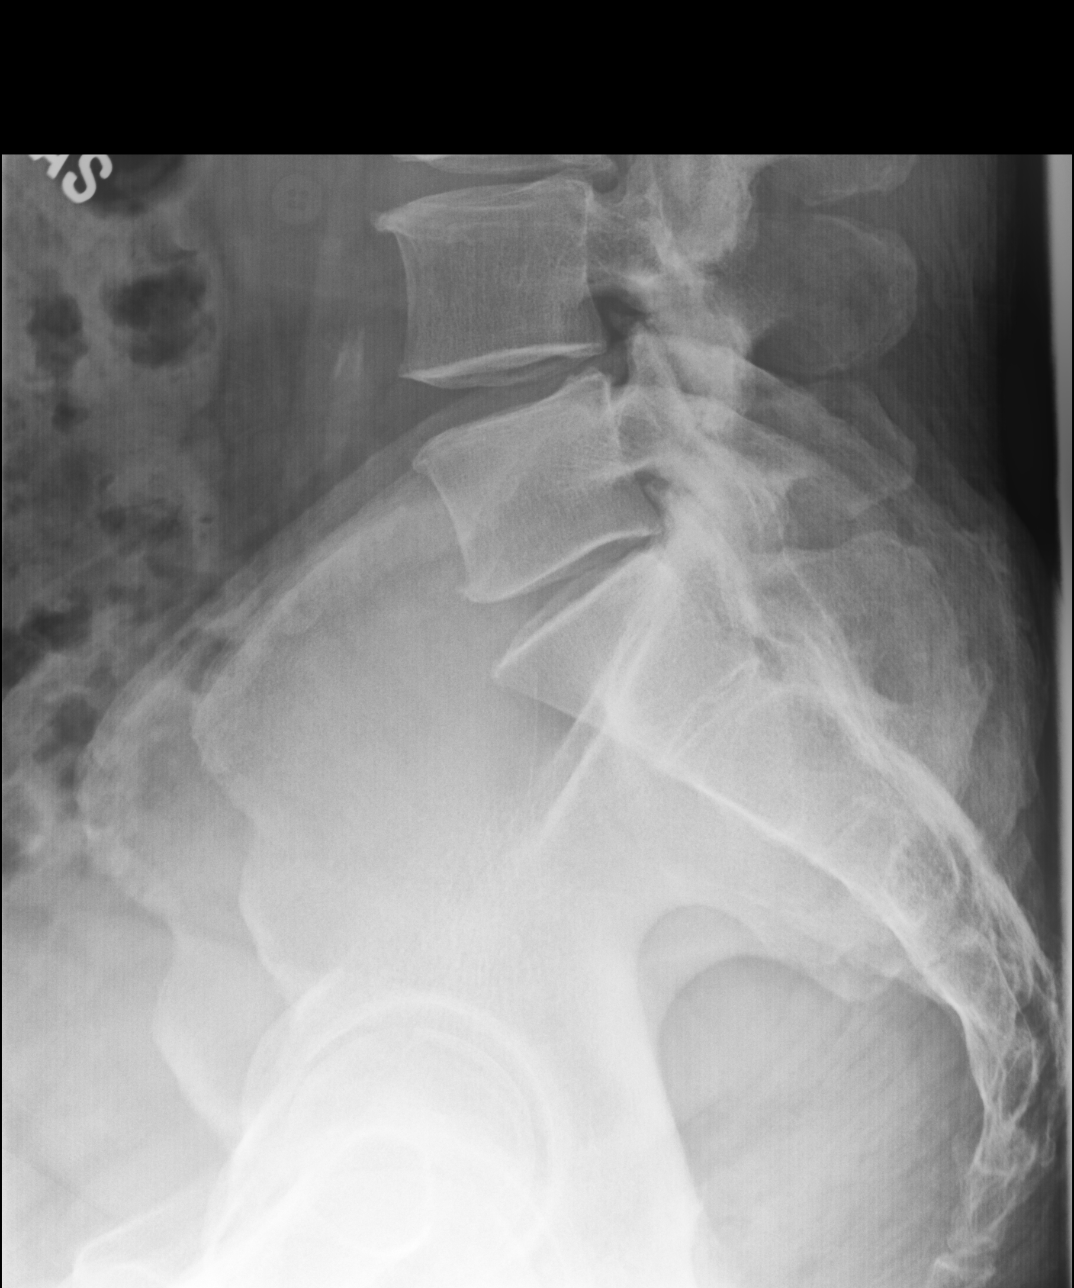

[3 of 3 positions shown; findings below may reference images not displayed]

FINDINGS: Mild degenerative spondylitic changes throughout the lumbar spine,
with mild disc space narrowings and mild osseous spurring at
multiple levels, and with associated degenerative hypertrophy about
the facets of the mid and lower lumbar spine.

Alignment is normal. No acute or suspicious osseous finding.
Visualized paravertebral soft tissues are unremarkable.
IMPRESSION: 1. Degenerative changes, as detailed above. Suspect some degree of
osseous neural foramen narrowing within the lower lumbar spine with
possible associated nerve root impingement. Given the new RIGHT leg
symptoms, would consider lumbar spine MRI for further
characterization.
2. No acute findings.

## 2019-08-15 ENCOUNTER — Ambulatory Visit: Payer: Self-pay | Attending: Internal Medicine

## 2019-08-15 DIAGNOSIS — Z20822 Contact with and (suspected) exposure to covid-19: Secondary | ICD-10-CM

## 2019-08-17 LAB — NOVEL CORONAVIRUS, NAA: SARS-CoV-2, NAA: NOT DETECTED

## 2019-08-20 ENCOUNTER — Ambulatory Visit: Payer: Self-pay | Attending: Internal Medicine

## 2019-08-20 DIAGNOSIS — U071 COVID-19: Secondary | ICD-10-CM | POA: Insufficient documentation

## 2019-08-20 DIAGNOSIS — Z20822 Contact with and (suspected) exposure to covid-19: Secondary | ICD-10-CM

## 2019-08-21 LAB — NOVEL CORONAVIRUS, NAA: SARS-CoV-2, NAA: DETECTED — AB

## 2020-11-15 ENCOUNTER — Ambulatory Visit: Payer: Self-pay | Admitting: Physician Assistant

## 2020-11-23 ENCOUNTER — Ambulatory Visit: Payer: Self-pay | Admitting: Physician Assistant

## 2022-07-25 ENCOUNTER — Other Ambulatory Visit: Payer: Self-pay | Admitting: Neurosurgery

## 2022-07-25 DIAGNOSIS — M48062 Spinal stenosis, lumbar region with neurogenic claudication: Secondary | ICD-10-CM

## 2022-08-18 ENCOUNTER — Ambulatory Visit
Admission: RE | Admit: 2022-08-18 | Discharge: 2022-08-18 | Disposition: A | Discharge status: Home / Self Care | Payer: No Typology Code available for payment source | Source: Ambulatory Visit | Attending: Neurosurgery | Admitting: Neurosurgery

## 2022-08-18 DIAGNOSIS — M48062 Spinal stenosis, lumbar region with neurogenic claudication: Secondary | ICD-10-CM

## 2023-06-01 ENCOUNTER — Other Ambulatory Visit: Payer: Self-pay | Admitting: Urology

## 2023-06-01 DIAGNOSIS — R972 Elevated prostate specific antigen [PSA]: Secondary | ICD-10-CM

## 2023-07-20 ENCOUNTER — Encounter: Payer: Self-pay | Admitting: Urology

## 2023-07-26 ENCOUNTER — Ambulatory Visit
Admission: RE | Admit: 2023-07-26 | Discharge: 2023-07-26 | Disposition: A | Discharge status: Home / Self Care | Payer: No Typology Code available for payment source | Source: Ambulatory Visit | Attending: Urology | Admitting: Urology

## 2023-07-26 DIAGNOSIS — R972 Elevated prostate specific antigen [PSA]: Secondary | ICD-10-CM

## 2023-07-26 MED ORDER — GADOPICLENOL 0.5 MMOL/ML IV SOLN
10.0000 mL | Freq: Once | INTRAVENOUS | Status: AC | PRN
  Administered 2023-07-26: 10 mL via INTRAVENOUS

## 2023-10-01 ENCOUNTER — Encounter: Payer: Self-pay | Admitting: Radiation Oncology

## 2023-10-01 NOTE — Progress Notes (Signed)
 GU Location of Tumor / Histology: Prostate Ca  If Prostate Cancer, Gleason Score is (3 + 4) and PSA is (4.84 on 04/02/2023)  Michael Randall. presented as referral from Dr. Jettie Pagan [facility]) for elevated PSA.  Biopsies     07/26/2023 Dr. Jettie Pagan MR Prostate with/without Contrast CLINICAL DATA: Elevated PSA level. R97.20   IMPRESSION: 1. PI-RADS category 4 lesion of the right posteromedial peripheral zone. Targeting data sent to [facility]. 2. Prostatomegaly and benign prostatic hypertrophy. 3. Sigmoid colon diverticulosis.    Past/Anticipated interventions by urology, if any:     Past/Anticipated interventions by medical oncology, if any: NA  Weight changes, if any:  No  IPSS:  15 SHIM:  25  Bowel/Bladder complaints, if any: Weak urine stream. No bowel issues.  Nausea/Vomiting, if any: No  Pain issues, if any:  No  SAFETY ISSUES: Prior radiation?  No Pacemaker/ICD? No Possible current pregnancy? Male Is the patient on methotrexate? No  Current Complaints / other details:

## 2023-10-07 NOTE — Progress Notes (Signed)
 Radiation Oncology         [phone] ________________________________  Initial Outpatient Consultation  Name: Michael Randall. MRN: [id number]  Date: 10/09/2023  DOB: 12/12/1955  FA:OZHY, Michael Ghee, MD  Jannifer Hick, MD   REFERRING PHYSICIAN: Jannifer Hick, MD  DIAGNOSIS: 68 y.o. gentleman with Stage T1c adenocarcinoma of the prostate with Gleason score of 3+4, and PSA of 4.84.  No diagnosis found.  HISTORY OF PRESENT ILLNESS: Michael Randall. is a 69 y.o. male with a diagnosis of prostate cancer. He was noted to have an elevated PSA of 4.84 by his primary care physician, Dr. Clelia Croft.  Accordingly, he was referred for evaluation in urology by Dr. Cardell Peach on 05/29/23,  digital rectal examination performed at that time showed no nodules. He underwent prostate MRI on 07/26/23 showing: PI-RADS 4 lesion of right posteromedial peripheral zone. The patient proceeded to MRI fusion biopsy of the prostate on 09/19/23.  The prostate volume measured 69 cc.  Out of 16 core biopsies, 13 were positive.  The maximum Gleason score was 3+4, and this was seen in one sample from ROI, left mid lateral, left base (small focus), and left mid (with perineural invasion). Additionally, Gleason 3+3 was seen in right mid lateral (small focus), right base lateral, right mid, right base (small focus), left apex (small focus), left base lateral (small focus, with PNI), and remaining three samples from ROI (all small foci).  The patient reviewed the biopsy results with his urologist and he has kindly been referred today for discussion of potential radiation treatment options.   PREVIOUS RADIATION THERAPY: No  PAST MEDICAL HISTORY:  Past Medical History:  Diagnosis Date   Anal fistula    Arthritis    At risk for sleep apnea    STOP-BANG = 5     SENT TO PCP 12-16-2013   BPH with elevated PSA and lower urinary tract symptoms    Elevated PSA    GERD (gastroesophageal reflux disease)    Gout    Hyperlipidemia     Sleep apnea    Weak urinary stream    Wears contact lenses       PAST SURGICAL HISTORY: Past Surgical History:  Procedure Laterality Date   ANAL FISTULECTOMY N/A 07/01/2014   Procedure: LIGATION OF INTERNAL FISTULA TRACT;  Surgeon: Romie Levee, MD;  Location: [facility] [place];  Service: General;  Laterality: N/A;   EVALUATION UNDER ANESTHESIA WITH ANAL FISTULECTOMY N/A 12/19/2013   Procedure: EXAM UNDER ANESTHESIA , ANAL FISTULOTOMY;  Surgeon: Romie Levee, MD;  Location: [facility] [place];  Service: General;  Laterality: N/A;   KNEE ARTHROSCOPY Left 10/13/2015   Procedure: LEFT KNEE ARTHROSCOPY WITH Medial meninus debridement and chrondroplasty ;  Surgeon: Ollen Gross, MD;  Location: [facility];  Service: Orthopedics;  Laterality: Left;   NASAL SINUS SURGERY  08/08/2003   PLACEMENT OF SETON N/A 12/19/2013   Procedure:  PLACEMENT OF SETON;  Surgeon: Romie Levee, MD;  Location: [facility] [place];  Service: General;  Laterality: N/A;   PROSTATE BIOPSY      FAMILY HISTORY:  Family History  Problem Relation Age of Onset   Cancer Mother         breast   Heart disease Father    Cancer Sister        breast   Cancer Maternal Grandmother        breast ca    SOCIAL HISTORY:  Social History   Socioeconomic History   Marital  status: Married    Spouse name: Not on file   Number of children: Not on file   Years of education: Not on file   Highest education level: Not on file  Occupational History   Not on file  Tobacco Use   Smoking status: Former    Current packs/day: 0.00    Average packs/day: 0.8 packs/day for 15.0 years (11.3 ttl pk-yrs)    Types: Cigarettes, Cigars    Start date: 09/18/1988    Quit date: 09/19/2003    Years since quitting: 20.0   Smokeless tobacco: Never  Substance and Sexual Activity   Alcohol use: Yes    Comment: occasional   Drug use: No   Sexual activity: Not on file  Other Topics Concern   Not on file  Social History  Narrative   Not on file   Social Drivers of Health   Financial Resource Strain: Not on file  Food Insecurity: Not on file  Transportation Needs: Not on file  Physical Activity: Not on file  Stress: Not on file  Social Connections: Not on file  Intimate Partner Violence: Not on file    ALLERGIES: Vicodin [hydrocodone-acetaminophen] and Augmentin [amoxicillin-pot clavulanate]  MEDICATIONS:  Current Outpatient Medications  Medication Sig Dispense Refill   traMADol (ULTRAM) 50 MG tablet Take 50 mg by mouth 2 (two) times daily.     aspirin 81 MG tablet Take 81 mg by mouth daily.     cetirizine (ZYRTEC) 10 MG tablet Take 10 mg by mouth daily.     colchicine 0.6 MG tablet Take 0.6 mg by mouth daily.     diclofenac sodium (VOLTAREN) 1 % GEL Apply 2 g topically 4 (four) times daily.     EPINEPHrine 0.3 mg/0.3 mL IJ SOAJ injection Inject 0.3 mg into the muscle once.     methocarbamol (ROBAXIN) 500 MG tablet Take 1 tablet (500 mg total) by mouth 4 (four) times daily. As needed for muscle spasm 30 tablet 1   Multiple Vitamin (MULTIVITAMIN WITH MINERALS) TABS tablet Take 1 tablet by mouth daily.     Multiple Vitamins-Minerals (MULTIVITAMIN WITH MINERALS) tablet Take 1 tablet by mouth daily. gnc mega men 50     oxyCODONE (ROXICODONE) 5 MG immediate release tablet Take 1-2 tablets (5-10 mg total) by mouth every 4 (four) hours as needed for severe pain. 40 tablet 0   pravastatin (PRAVACHOL) 20 MG tablet Take 20 mg by mouth every evening.      No current facility-administered medications for this encounter.    REVIEW OF SYSTEMS:  On review of systems, the patient reports that he is doing well overall. He denies any chest pain, shortness of breath, cough, fevers, chills, night sweats, unintended weight changes. He denies any bowel disturbances, and denies abdominal pain, nausea or vomiting. He denies any new musculoskeletal or joint aches or pains. His IPSS was ***, indicating *** urinary symptoms.  His SHIM was ***, indicating he {does not have/has mild/moderate/severe} erectile dysfunction. A complete review of systems is obtained and is otherwise negative.    PHYSICAL EXAM:  Wt Readings from Last 3 Encounters:  10/13/15 248 lb 12.8 oz (112.9 kg)  07/01/14 251 lb (113.9 kg)  02/25/14 253 lb (114.8 kg)   Temp Readings from Last 3 Encounters:  10/13/15 98.1 F (36.7 C) (Oral)  07/01/14 98.5 F (36.9 C)  02/25/14 98 F (36.7 C)   BP Readings from Last 3 Encounters:  10/13/15 (!) 148/90  07/01/14 137/81  02/25/14 126/86  Pulse Readings from Last 3 Encounters:  10/13/15 70  07/01/14 74  02/25/14 82    /10  In general this is a well appearing *** male in no acute distress. He's alert and oriented x4 and appropriate throughout the examination. Cardiopulmonary assessment is negative for acute distress, and he exhibits normal effort.     KPS = ***  100 - Normal; no complaints; no evidence of disease. 90   - Able to carry on normal activity; minor signs or symptoms of disease. 80   - Normal activity with effort; some signs or symptoms of disease. 45   - Cares for self; unable to carry on normal activity or to do active work. 60   - Requires occasional assistance, but is able to care for most of his personal needs. 50   - Requires considerable assistance and frequent medical care. 40   - Disabled; requires special care and assistance. 30   - Severely disabled; hospital admission is indicated although death not imminent. 20   - Very sick; hospital admission necessary; active supportive treatment necessary. 10   - Moribund; fatal processes progressing rapidly. 0     - Dead  Karnofsky DA, Abelmann WH, Craver LS and Burchenal [facility] [phone]) The use of the nitrogen mustards in the palliative treatment of carcinoma: with particular reference to bronchogenic carcinoma Cancer 1 634-56  LABORATORY DATA:  Lab Results  Component Value Date   WBC 6.0 10/13/2015   HGB 14.2 10/13/2015    HCT 43.2 10/13/2015   MCV 86.9 10/13/2015   PLT 202 10/13/2015   No results found for: "NA", "K", "CL", "CO2" No results found for: "ALT", "AST", "GGT", "ALKPHOS", "BILITOT"   RADIOGRAPHY: No results found.    IMPRESSION/PLAN: 1. 68 y.o. gentleman with Stage T1c adenocarcinoma of the prostate with Gleason Score of 3+4, and PSA of 4.84. We discussed the patient's workup and outlined the nature of prostate cancer in this setting. The patient's T stage, Gleason's score, and PSA put him into the favorable intermediate risk group. Accordingly, he is eligible for a variety of potential treatment options including brachytherapy, 5.5 weeks of external radiation, or prostatectomy. We discussed the available radiation techniques, and focused on the details and logistics of delivery. The patient may not be an ideal candidate for brachytherapy/boost with a prostate volume of 69 cc{ prior to downsizing from hormone therapy. We discussed that based on his prostate volume, he would require beginning treatment with a 5 alpha reductase inhibitor +/- ADT for at least 3 months to allow for downsizing of the prostate prior to initiating brachytherapy}. We discussed and outlined the risks, benefits, short and long-term effects associated with radiotherapy and compared and contrasted these with prostatectomy. We discussed the role of SpaceOAR gel in reducing the rectal toxicity associated with radiotherapy. He appears to have a good understanding of his disease and our treatment recommendations which are of curative intent.  He was encouraged to ask questions that were answered to his stated satisfaction.  At the conclusion of our conversation, the patient is interested in moving forward with ***.  We personally spent *** minutes in this encounter including chart review, reviewing radiological studies, meeting face-to-face with the patient, entering orders and completing documentation.    Marguarite Arbour, PA-C     Margaretmary Dys, MD  [facility]  Radiation Oncology Direct Dial: [phone]  Fax: [phone] [email or web address]  Skype  [email or web address]   This document serves as a record of services personally performed by Molli Hazard  Kathrynn Running, MD and [employer], PA-C. It was created on their behalf by Mickie Bail, a trained medical scribe. The creation of this record is based on the scribe's personal observations and the provider's statements to them. This document has been checked and approved by the attending provider.

## 2023-10-09 ENCOUNTER — Ambulatory Visit
Admission: RE | Admit: 2023-10-09 | Discharge: 2023-10-09 | Disposition: A | Discharge status: Home / Self Care | Payer: No Typology Code available for payment source | Source: Ambulatory Visit | Attending: Radiation Oncology | Admitting: Radiation Oncology

## 2023-10-09 ENCOUNTER — Encounter: Payer: Self-pay | Admitting: Radiation Oncology

## 2023-10-09 VITALS — BP 135/88 | HR 69 | Temp 97.1°F | Resp 18 | Ht 73.0 in | Wt 268.6 lb

## 2023-10-09 DIAGNOSIS — C61 Malignant neoplasm of prostate: Secondary | ICD-10-CM | POA: Insufficient documentation

## 2023-10-09 DIAGNOSIS — Z79899 Other long term (current) drug therapy: Secondary | ICD-10-CM | POA: Diagnosis not present

## 2023-10-09 DIAGNOSIS — M129 Arthropathy, unspecified: Secondary | ICD-10-CM | POA: Diagnosis not present

## 2023-10-09 DIAGNOSIS — Z803 Family history of malignant neoplasm of breast: Secondary | ICD-10-CM | POA: Diagnosis not present

## 2023-10-09 DIAGNOSIS — E785 Hyperlipidemia, unspecified: Secondary | ICD-10-CM | POA: Insufficient documentation

## 2023-10-09 DIAGNOSIS — Z87891 Personal history of nicotine dependence: Secondary | ICD-10-CM | POA: Diagnosis not present

## 2023-10-09 DIAGNOSIS — K219 Gastro-esophageal reflux disease without esophagitis: Secondary | ICD-10-CM | POA: Diagnosis not present

## 2023-10-09 DIAGNOSIS — G4730 Sleep apnea, unspecified: Secondary | ICD-10-CM | POA: Insufficient documentation

## 2023-10-09 DIAGNOSIS — Z791 Long term (current) use of non-steroidal anti-inflammatories (NSAID): Secondary | ICD-10-CM | POA: Diagnosis not present

## 2023-10-09 HISTORY — DX: Poor urinary stream: R39.12

## 2023-10-09 HISTORY — DX: Benign prostatic hyperplasia with lower urinary tract symptoms: N40.1

## 2023-10-09 HISTORY — DX: Elevated prostate specific antigen (PSA): R97.20

## 2023-10-09 HISTORY — DX: Sleep apnea, unspecified: G47.30

## 2023-10-09 HISTORY — DX: Gout, unspecified: M10.9

## 2023-10-12 NOTE — Progress Notes (Signed)
 Spoke with Michael Randall via telephone to introduce myself as the prostate nurse navigator and discussed my role.  No barriers to care identified at this time.  Michael Randall is wanting to follow up with Dr. Cardell Peach on 3/26 before finalizing treatment decision.  I provided my contact information and asked for him to call me with questions or concerns.  Verbalized understanding.

## 2023-11-01 NOTE — Progress Notes (Signed)
 Patient confirmed with urologist that he would like to proceed with 5.5 weeks of daily radiation for his stage T1c adenocarcinoma of the prostate with Gleason score of 3+4, and PSA of 4.84.   Pending fiducial's and spaceOAR date at this time. Plan of care in progress.

## 2023-11-07 ENCOUNTER — Other Ambulatory Visit: Payer: Self-pay | Admitting: Urology

## 2023-12-05 ENCOUNTER — Other Ambulatory Visit: Payer: Self-pay | Admitting: Urology

## 2023-12-05 DIAGNOSIS — C61 Malignant neoplasm of prostate: Secondary | ICD-10-CM

## 2024-01-07 NOTE — Progress Notes (Addendum)
 Left message for call back to assess any new navigational needs/barriers prior to upcoming radiation start.   RN spoke with patient and reviewed next steps. All questions answered.  No additional needs at this time.

## 2024-01-28 ENCOUNTER — Encounter (HOSPITAL_COMMUNITY): Payer: Self-pay | Admitting: Urology

## 2024-01-28 NOTE — Progress Notes (Signed)
 Spoke w/ via phone for pre-op interview--- [facility] needs dos----  NONE       Lab results------ COVID test -----patient states asymptomatic no test needed Arrive at -------0630 NPO after MN NO Solid Food.   Pre-Surgery Ensure or G2:  Med rec completed Medications to take morning of surgery -----NONE Diabetic medication -----  GLP1 agonist last dose: GLP1 instructions:  Patient instructed no nail polish to be worn day of surgery Patient instructed to bring photo id and insurance card day of surgery Patient aware to have Driver (ride ) / caregiver    for 24 hours after surgery - Wife Graeme Menees Patient Special Instructions ----- Shower with antibacterial soap. Fleet enema per surgeons instructions. Pre-Op special Instructions -----  Patient verbalized understanding of instructions that were given at this phone interview. Patient denies chest pain, sob, fever, cough at the interview.

## 2024-02-01 ENCOUNTER — Ambulatory Visit (HOSPITAL_COMMUNITY)
Admission: RE | Admit: 2024-02-01 | Discharge: 2024-02-01 | Disposition: A | Discharge status: Home / Self Care | Attending: Urology | Admitting: Urology

## 2024-02-01 ENCOUNTER — Encounter (HOSPITAL_COMMUNITY)
Admission: RE | Disposition: A | Discharge status: Home / Self Care | Payer: Self-pay | Source: Home / Self Care | Attending: Urology

## 2024-02-01 ENCOUNTER — Encounter (HOSPITAL_COMMUNITY): Payer: Self-pay | Admitting: Urology

## 2024-02-01 ENCOUNTER — Telehealth: Payer: Self-pay | Admitting: *Deleted

## 2024-02-01 ENCOUNTER — Ambulatory Visit (HOSPITAL_BASED_OUTPATIENT_CLINIC_OR_DEPARTMENT_OTHER)

## 2024-02-01 ENCOUNTER — Other Ambulatory Visit: Payer: Self-pay

## 2024-02-01 ENCOUNTER — Ambulatory Visit (HOSPITAL_COMMUNITY)

## 2024-02-01 DIAGNOSIS — C61 Malignant neoplasm of prostate: Secondary | ICD-10-CM | POA: Diagnosis present

## 2024-02-01 DIAGNOSIS — M1990 Unspecified osteoarthritis, unspecified site: Secondary | ICD-10-CM | POA: Insufficient documentation

## 2024-02-01 DIAGNOSIS — Z87891 Personal history of nicotine dependence: Secondary | ICD-10-CM | POA: Diagnosis not present

## 2024-02-01 DIAGNOSIS — G4730 Sleep apnea, unspecified: Secondary | ICD-10-CM | POA: Insufficient documentation

## 2024-02-01 HISTORY — PX: SPACE OAR INSTILLATION: SHX6769

## 2024-02-01 HISTORY — PX: GOLD SEED IMPLANT: SHX6343

## 2024-02-01 SURGERY — INSERTION, GOLD SEEDS
Anesthesia: Monitor Anesthesia Care | Site: Prostate

## 2024-02-01 MED ORDER — CEFAZOLIN SODIUM-DEXTROSE 2-4 GM/100ML-% IV SOLN
INTRAVENOUS | Status: AC
  Filled 2024-02-01: qty 100

## 2024-02-01 MED ORDER — MIDAZOLAM HCL 2 MG/2ML IJ SOLN
INTRAMUSCULAR | Status: AC
  Filled 2024-02-01: qty 2

## 2024-02-01 MED ORDER — MIDAZOLAM HCL 2 MG/2ML IJ SOLN
INTRAMUSCULAR | Status: DC | PRN
  Administered 2024-02-01: 2 mg via INTRAVENOUS

## 2024-02-01 MED ORDER — CHLORHEXIDINE GLUCONATE 0.12 % MT SOLN
OROMUCOSAL | Status: DC
Start: 2024-02-01 — End: 2024-02-01
  Filled 2024-02-01: qty 15

## 2024-02-01 MED ORDER — CHLORHEXIDINE GLUCONATE 0.12 % MT SOLN
15.0000 mL | Freq: Once | OROMUCOSAL | Status: AC
Start: 2024-02-01 — End: 2024-02-01
  Administered 2024-02-01: 15 mL via OROMUCOSAL

## 2024-02-01 MED ORDER — FENTANYL CITRATE (PF) 100 MCG/2ML IJ SOLN: 25.0000 ug | INTRAMUSCULAR | Status: DC | PRN

## 2024-02-01 MED ORDER — PROPOFOL 10 MG/ML IV BOLUS
INTRAVENOUS | Status: DC | PRN
  Administered 2024-02-01 (×2): 30 mg via INTRAVENOUS

## 2024-02-01 MED ORDER — ORAL CARE MOUTH RINSE: 15.0000 mL | Freq: Once | OROMUCOSAL | Status: AC

## 2024-02-01 MED ORDER — PROPOFOL 500 MG/50ML IV EMUL
INTRAVENOUS | Status: DC | PRN
  Administered 2024-02-01: 200 ug/kg/min via INTRAVENOUS

## 2024-02-01 MED ORDER — EPHEDRINE SULFATE-NACL 50-0.9 MG/10ML-% IV SOSY
PREFILLED_SYRINGE | INTRAVENOUS | Status: DC | PRN
  Administered 2024-02-01: 7.5 mg via INTRAVENOUS

## 2024-02-01 MED ORDER — LACTATED RINGERS IV SOLN: INTRAVENOUS | Status: DC | PRN

## 2024-02-01 MED ORDER — CEFAZOLIN SODIUM-DEXTROSE 2-4 GM/100ML-% IV SOLN
2.0000 g | INTRAVENOUS | Status: AC
  Administered 2024-02-01: 2 g via INTRAVENOUS

## 2024-02-01 MED ORDER — OXYCODONE HCL 5 MG/5ML PO SOLN: 5.0000 mg | Freq: Once | ORAL | Status: DC | PRN

## 2024-02-01 MED ORDER — LIDOCAINE HCL 2 % IJ SOLN
INTRAMUSCULAR | Status: DC | PRN
  Administered 2024-02-01: 10 mL

## 2024-02-01 MED ORDER — SODIUM CHLORIDE (PF) 0.9 % IJ SOLN
INTRAMUSCULAR | Status: DC | PRN
  Administered 2024-02-01 (×2): 10 mL

## 2024-02-01 MED ORDER — OXYCODONE HCL 5 MG PO TABS: 5.0000 mg | ORAL_TABLET | Freq: Once | ORAL | Status: DC | PRN

## 2024-02-01 MED ORDER — LACTATED RINGERS IV SOLN: INTRAVENOUS | Status: DC

## 2024-02-01 MED ORDER — ONDANSETRON HCL 4 MG/2ML IJ SOLN: 4.0000 mg | Freq: Four times a day (QID) | INTRAMUSCULAR | Status: DC | PRN

## 2024-02-01 SURGICAL SUPPLY — 21 items
BENZOIN TINCTURE PRP APPL 2/3 (GAUZE/BANDAGES/DRESSINGS) IMPLANT
BLADE CLIPPER SENSICLIP SURGIC (BLADE) ×1 IMPLANT
CNTNR URN SCR LID CUP LEK RST (MISCELLANEOUS) ×1 IMPLANT
COVER BACK TABLE 60X90IN (DRAPES) ×1 IMPLANT
DRSG TEGADERM 4X4.75 (GAUZE/BANDAGES/DRESSINGS) ×1 IMPLANT
DRSG TEGADERM 8X12 (GAUZE/BANDAGES/DRESSINGS) ×1 IMPLANT
GAUZE SPONGE 4X4 12PLY STRL (GAUZE/BANDAGES/DRESSINGS) ×1 IMPLANT
GLOVE BIO SURGEON STRL SZ7 (GLOVE) ×1 IMPLANT
IMPL SPACEOAR SYSTEM 10ML (Spacer) IMPLANT
MARKER GOLD PRELOAD 1.2X3 (Urological Implant) ×1 IMPLANT
MARKER SKIN DUAL TIP RULER LAB (MISCELLANEOUS) ×1 IMPLANT
NDL SPNL 22GX3.5 QUINCKE BK (NEEDLE) IMPLANT
NEEDLE SPNL 22GX3.5 QUINCKE BK (NEEDLE) ×1 IMPLANT
SHEATH ULTRASOUND LF (SHEATH) IMPLANT
SHEATH ULTRASOUND LTX NONSTRL (SHEATH) IMPLANT
SLEEVE SCD COMPRESS KNEE MED (STOCKING) ×1 IMPLANT
SURGILUBE 2OZ TUBE FLIPTOP (MISCELLANEOUS) ×1 IMPLANT
SYR 10ML LL (SYRINGE) IMPLANT
SYR CONTROL 10ML LL (SYRINGE) ×1 IMPLANT
TOWEL OR 17X24 6PK STRL BLUE (TOWEL DISPOSABLE) ×1 IMPLANT
UNDERPAD 30X36 HEAVY ABSORB (UNDERPADS AND DIAPERS) ×1 IMPLANT

## 2024-02-01 NOTE — Anesthesia Preprocedure Evaluation (Signed)
 Anesthesia Evaluation  Patient identified by MRN, date of birth, ID band Patient awake    Reviewed: Allergy & Precautions, H&P , NPO status , Patient's Chart, lab work & pertinent test results  Airway Mallampati: II   Neck ROM: full    Dental   Pulmonary sleep apnea , former smoker   breath sounds clear to auscultation       Cardiovascular negative cardio ROS  Rhythm:regular Rate:Normal     Neuro/Psych    GI/Hepatic ,GERD  ,,  Endo/Other    Renal/GU      Musculoskeletal  (+) Arthritis ,    Abdominal   Peds  Hematology   Anesthesia Other Findings   Reproductive/Obstetrics                             Anesthesia Physical Anesthesia Plan  ASA: 3  Anesthesia Plan: MAC   Post-op Pain Management:    Induction: Intravenous  PONV Risk Score and Plan: 1 and Midazolam , Treatment may vary due to age or medical condition and Propofol  infusion  Airway Management Planned: Simple Face Mask  Additional Equipment:   Intra-op Plan:   Post-operative Plan:   Informed Consent: I have reviewed the patients History and Physical, chart, labs and discussed the procedure including the risks, benefits and alternatives for the proposed anesthesia with the patient or authorized representative who has indicated his/her understanding and acceptance.     Dental advisory given  Plan Discussed with: CRNA, Anesthesiologist and Surgeon  Anesthesia Plan Comments:        Anesthesia Quick Evaluation

## 2024-02-01 NOTE — H&P (Signed)
 H&P  History of Present Illness: Michael Dorey. is a 68 y.o. year old M who presents today for spaceOAR/fiducial markers  Past Medical History:  Diagnosis Date   Anal fistula    Arthritis    At risk for sleep apnea    STOP-BANG = 5     SENT TO PCP 12-16-2013. wears CPAP   BPH with elevated PSA and lower urinary tract symptoms    Elevated PSA    GERD (gastroesophageal reflux disease)    Gout    Hyperlipidemia    Sleep apnea    Weak urinary stream    Wears contact lenses     Past Surgical History:  Procedure Laterality Date   ANAL FISTULECTOMY N/A 07/01/2014   Procedure: LIGATION OF INTERNAL FISTULA TRACT;  Surgeon: Bernarda Ned, MD;  Location: [facility];  Service: General;  Laterality: N/A;   EVALUATION UNDER ANESTHESIA WITH ANAL FISTULECTOMY N/A 12/19/2013   Procedure: EXAM UNDER ANESTHESIA , ANAL FISTULOTOMY;  Surgeon: Bernarda Ned, MD;  Location: [facility];  Service: General;  Laterality: N/A;   KNEE ARTHROSCOPY Left 10/13/2015   Procedure: LEFT KNEE ARTHROSCOPY WITH Medial meninus debridement and chrondroplasty ;  Surgeon: Dempsey Moan, MD;  Location: [facility];  Service: Orthopedics;  Laterality: Left;   NASAL SINUS SURGERY  08/08/2003   PLACEMENT OF SETON N/A 12/19/2013   Procedure:  PLACEMENT OF SETON;  Surgeon: Bernarda Ned, MD;  Location: [facility];  Service: General;  Laterality: N/A;   PROSTATE BIOPSY      Home Medications:  Current Meds  Medication Sig   baclofen (LIORESAL) 10 MG tablet Take 10 mg by mouth daily as needed.   levocetirizine (XYZAL) 5 MG tablet Take 2.5 mg by mouth as needed for allergies.   meloxicam (MOBIC) 15 MG tablet Take 15 mg by mouth daily.    Allergies:  Allergies  Allergen Reactions   Augmentin [Amoxicillin-Pot Clavulanate] Hives    .SABRAHas patient had a PCN reaction causing immediate rash, facial/tongue/throat swelling, SOB or lightheadedness with hypotension: No Has patient had  a PCN reaction causing severe rash involving mucus membranes or skin necrosis: No Has patient had a PCN reaction that required hospitalization No Has patient had a PCN reaction occurring within the last 10 years: No If all of the above answers are NO, then may proceed with Cephalosporin use.     Family History  Problem Relation Age of Onset   Cancer Mother         breast   Heart disease Father    Cancer Sister        breast   Cancer Maternal Grandmother        breast ca    Social History:  reports that he quit smoking about 20 years ago. His smoking use included cigarettes and cigars. He started smoking about 35 years ago. He has a 11.3 pack-year smoking history. He has never used smokeless tobacco. He reports current alcohol use. He reports that he does not use drugs.  ROS: A complete review of systems was performed.  All systems are negative except for pertinent findings as noted.  Physical Exam:  Vital signs in last 24 hours: Temp:  [98.4 F (36.9 C)] 98.4 F (36.9 C) (06/27 0654) Pulse Rate:  [65] 65 (06/27 0654) Resp:  [17] 17 (06/27 0654) BP: (140)/(74) 140/74 (06/27 0654) SpO2:  [96 %] 96 % (06/27 0654) Weight:  [115.7 kg] 115.7 kg (06/27 0654) Constitutional:  Alert  and oriented, No acute distress Cardiovascular: Regular rate and rhythm Respiratory: Normal respiratory effort, Lungs clear bilaterally GI: Abdomen is soft, nontender, nondistended, no abdominal masses Lymphatic: No lymphadenopathy Neurologic: Grossly intact, no focal deficits Psychiatric: Normal mood and affect   Laboratory Data:  No results for input(s): WBC, HGB, HCT, PLT in the last 72 hours.  No results for input(s): NA, K, CL, GLUCOSE, BUN, CALCIUM, CREATININE in the last 72 hours.  Invalid input(s): CO3   No results found for this or any previous visit (from the past 24 hours). No results found for this or any previous visit (from the past 240 hours).  Renal  Function: No results for input(s): CREATININE in the last 168 hours. CrCl cannot be calculated (No successful lab value found.).  Radiologic Imaging: No results found.  Assessment:  Michael Randall. is a 68 y.o. year old M here today for fiducial markers and spaceOAR  Plan:  To OR as planned. Procedure and risks reviewed (bleeding, infection, retention, rectal injury, failure to complete procedure). Discussed given history of anal fistula may be higher risk of rectal related complications  Herlene Foot, MD 02/01/2024, 7:35 AM  [facility] Pager: [phone]

## 2024-02-01 NOTE — Op Note (Signed)
 Preoperative diagnosis: Clinically localized adenocarcinoma of the prostate   Postoperative diagnosis: Clinically localized adenocarcinoma of the prostate  Procedure: 1) Placement of fiducial markers into prostate                    2) Insertion of SpaceOAR hydrogel   Surgeon:Luke Samik Balkcom. M.D.  Anesthesia: General  EBL: Minimal  Complications: None  Indication: Michael Randall. is a 68 y.o. gentleman with clinically localized prostate cancer. After discussing management options for treatment, he elected to proceed with radiotherapy. He presents today for the above procedures. The potential risks, complications, alternative options, and expected recovery course have been discussed in detail with the patient and he has provided informed consent to proceed.  Description of procedure: The patient was administered preoperative antibiotics, placed in the dorsal lithotomy position, and prepped and draped in the usual sterile fashion. Next, transrectal ultrasonography was utilized to visualize the prostate. Three gold fiducial markers were then placed into the prostate via transperineal needles under ultrasound guidance at the left apex, left base, and right mid gland under direct ultrasound guidance. A site in the midline was then selected on the perineum for placement of an 18 g needle with saline. The needle was advanced above the rectum and below Denonvillier's fascia to the mid gland and confirmed to be in the midline on transverse imaging. One cc of saline was injected confirming appropriate expansion of this space. A total of 5 cc of saline was then injected to open the space further bilaterally. The saline syringe was then removed and the SpaceOAR hydrogel was injected with good distribution bilaterally. He tolerated the procedure well and without complications. He was given a voiding trial prior to discharge from the PACU.

## 2024-02-01 NOTE — Discharge Instructions (Addendum)
 You should avoid strenuous activities today but may resume all normal activities tomorrow.  2.   You can take Tylenol  as needed for any pain or discomfort.  3.    Follow up with your radiation oncologist for your simulation appointment as scheduled.  If this is not currently scheduled or you do not know the date/time for that appointment, please contact the radiation oncology office to confirm.  Post Anesthesia Home Care Instructions  Activity: Get plenty of rest for the remainder of the day. A responsible adult should stay with you for 24 hours following the procedure.  For the next 24 hours, DO NOT: -Drive a car -Advertising copywriter -Drink alcoholic beverages -Take any medication unless instructed by your physician -Make any legal decisions or sign important papers.  Meals: Start with liquid foods such as gelatin or soup. Progress to regular foods as tolerated. Avoid greasy, spicy, heavy foods. If nausea and/or vomiting occur, drink only clear liquids until the nausea and/or vomiting subsides. Call your physician if vomiting continues.  Special Instructions/Symptoms: Your throat may feel dry or sore from the anesthesia or the breathing tube placed in your throat during surgery. If this causes discomfort, gargle with warm salt water. The discomfort should disappear within 24 hours.  If you had a scopolamine patch placed behind your ear for the management of post- operative nausea and/or vomiting:  1. The medication in the patch is effective for 72 hours, after which it should be removed.  Wrap patch in a tissue and discard in the trash. Wash hands thoroughly with soap and water. 2. You may remove the patch earlier than 72 hours if you experience unpleasant side effects which may include dry mouth, dizziness or visual disturbances. 3. Avoid touching the patch. Wash your hands with soap and water after contact with the patch.  Call your surgeon if you experience:   1.  Fever over  101.0. 2.  Inability to urinate. 3.  Nausea and/or vomiting. 4.  Extreme swelling or bruising at the surgical site. 5.  Continued bleeding from the incision. 6.  Increased pain, redness or drainage from the incision. 7.  Problems related to your pain medication. 8. Any change in color, movement and/or sensation 9. Any problems and/or concerns

## 2024-02-01 NOTE — Transfer of Care (Signed)
 Immediate Anesthesia Transfer of Care Note  Patient: Michael Randall.  Procedure(s) Performed: INSERTION, GOLD SEEDS (Prostate) INJECTION, HYDROGEL SPACER (Prostate)  Patient Location: PACU  Anesthesia Type:MAC  Level of Consciousness: drowsy  Airway & Oxygen Therapy: Patient Spontanous Breathing  Post-op Assessment: Report given to RN  Post vital signs: Reviewed and stable  Last Vitals:  Vitals Value Taken Time  BP 106/58 02/01/24 09:25  Temp    Pulse 67 02/01/24 09:27  Resp 16 02/01/24 09:27  SpO2 95 % 02/01/24 09:27  Vitals shown include unfiled device data.  Last Pain:  Vitals:   02/01/24 0654  TempSrc: Oral  PainSc: 0-No pain      Patients Stated Pain Goal: 6 (02/01/24 0654)  Complications: No notable events documented.

## 2024-02-01 NOTE — Anesthesia Postprocedure Evaluation (Signed)
 Anesthesia Post Note  Patient: Michael Randall.  Procedure(s) Performed: INSERTION, GOLD SEEDS (Prostate) INJECTION, HYDROGEL SPACER (Prostate)     Patient location during evaluation: PACU Anesthesia Type: MAC Level of consciousness: awake and alert Pain management: pain level controlled Vital Signs Assessment: post-procedure vital signs reviewed and stable Respiratory status: spontaneous breathing, nonlabored ventilation, respiratory function stable and patient connected to nasal cannula oxygen Cardiovascular status: stable and blood pressure returned to baseline Postop Assessment: no apparent nausea or vomiting Anesthetic complications: no   No notable events documented.  Last Vitals:  Vitals:   02/01/24 0925 02/01/24 1000  BP: (!) 106/58 132/84  Pulse: 69 (!) 52  Resp: 16 13  Temp: 36.4 C   SpO2: 94% 100%    Last Pain:  Vitals:   02/01/24 1000  TempSrc:   PainSc: 0-No pain                 Cosby Proby S

## 2024-02-01 NOTE — Telephone Encounter (Signed)
 CALLED PATIENT TO INFORM OF SIM APPT. FOR 02-04-24- ARRIVAL TIME- 12:45 PM @ [facility], INFORMED PATIENT TO ARRIVE WITH A FULL BLADDER, INFORMED PATIENT OF MRI- ARRIVAL TIME- 6:30 AM ON 02-05-24 @ [facility], LVM FOR A RETURN CALL

## 2024-02-04 ENCOUNTER — Ambulatory Visit
Admission: RE | Admit: 2024-02-04 | Discharge: 2024-02-04 | Disposition: A | Discharge status: Home / Self Care | Source: Ambulatory Visit | Attending: Radiation Oncology | Admitting: Radiation Oncology

## 2024-02-04 ENCOUNTER — Telehealth: Payer: Self-pay | Admitting: *Deleted

## 2024-02-04 ENCOUNTER — Encounter (HOSPITAL_COMMUNITY): Payer: Self-pay | Admitting: Urology

## 2024-02-04 DIAGNOSIS — C61 Malignant neoplasm of prostate: Secondary | ICD-10-CM | POA: Insufficient documentation

## 2024-02-04 NOTE — Progress Notes (Signed)
  Radiation Oncology         [phone] ________________________________  Name: Michael Randall. MRN: [id number]  Date: 02/04/2024  DOB: 01-30-1956  SIMULATION AND TREATMENT PLANNING NOTE    ICD-10-CM   1. Malignant neoplasm of prostate (HCC)  C61       DIAGNOSIS:  68 y.o. gentleman with Stage T1c adenocarcinoma of the prostate with Gleason score of 3+4, and PSA of 4.84.   NARRATIVE:  The patient was brought to the CT Simulation planning suite.  Identity was confirmed.  All relevant records and images related to the planned course of therapy were reviewed.  The patient freely provided informed written consent to proceed with treatment after reviewing the details related to the planned course of therapy. The consent form was witnessed and verified by the simulation staff.  Then, the patient was set-up in a stable reproducible supine position for radiation therapy.  A vacuum lock pillow device was custom fabricated to position his legs in a reproducible immobilized position.  Then, I performed a urethrogram under sterile conditions to identify the prostatic apex.  CT images were obtained.  Surface markings were placed.  The CT images were loaded into the planning software.  Then the prostate target and avoidance structures including the rectum, bladder, bowel and hips were contoured.  Treatment planning then occurred.  The radiation prescription was entered and confirmed.  A total of one complex treatment devices was fabricated. I have requested : Intensity Modulated Radiotherapy (IMRT) is medically necessary for this case for the following reason:  Rectal sparing.SABRA  PLAN:  The patient will receive 70 Gy in 28 fractions.  ________________________________  Donnice FELIX Patrcia, M.D.

## 2024-02-04 NOTE — Telephone Encounter (Signed)
 RETURNED PATIENT'S PHONE CALL, LVM FOR A RETURN CALL

## 2024-02-05 ENCOUNTER — Ambulatory Visit (HOSPITAL_COMMUNITY)
Admission: RE | Admit: 2024-02-05 | Discharge: 2024-02-05 | Disposition: A | Discharge status: Home / Self Care | Source: Ambulatory Visit | Attending: Urology | Admitting: Urology

## 2024-02-05 DIAGNOSIS — C61 Malignant neoplasm of prostate: Secondary | ICD-10-CM | POA: Insufficient documentation

## 2024-02-06 DIAGNOSIS — C61 Malignant neoplasm of prostate: Secondary | ICD-10-CM | POA: Diagnosis present

## 2024-02-13 ENCOUNTER — Other Ambulatory Visit: Payer: Self-pay

## 2024-02-13 ENCOUNTER — Ambulatory Visit
Admission: RE | Admit: 2024-02-13 | Discharge: 2024-02-13 | Disposition: A | Discharge status: Home / Self Care | Source: Ambulatory Visit | Attending: Radiation Oncology | Admitting: Radiation Oncology

## 2024-02-13 DIAGNOSIS — C61 Malignant neoplasm of prostate: Secondary | ICD-10-CM | POA: Diagnosis not present

## 2024-02-13 LAB — RAD ONC ARIA SESSION SUMMARY
Course Elapsed Days: 0
Plan Fractions Treated to Date: 1
Plan Prescribed Dose Per Fraction: 2.5 Gy
Plan Total Fractions Prescribed: 28
Plan Total Prescribed Dose: 70 Gy
Reference Point Dosage Given to Date: 2.5 Gy
Reference Point Session Dosage Given: 2.5 Gy
Session Number: 1

## 2024-02-14 ENCOUNTER — Other Ambulatory Visit: Payer: Self-pay

## 2024-02-14 ENCOUNTER — Ambulatory Visit
Admission: RE | Admit: 2024-02-14 | Discharge: 2024-02-14 | Disposition: A | Discharge status: Home / Self Care | Source: Ambulatory Visit | Attending: Radiation Oncology | Admitting: Radiation Oncology

## 2024-02-14 DIAGNOSIS — C61 Malignant neoplasm of prostate: Secondary | ICD-10-CM | POA: Diagnosis not present

## 2024-02-14 LAB — RAD ONC ARIA SESSION SUMMARY
Course Elapsed Days: 1
Plan Fractions Treated to Date: 2
Plan Prescribed Dose Per Fraction: 2.5 Gy
Plan Total Fractions Prescribed: 28
Plan Total Prescribed Dose: 70 Gy
Reference Point Dosage Given to Date: 5 Gy
Reference Point Session Dosage Given: 2.5 Gy
Session Number: 2

## 2024-02-15 ENCOUNTER — Ambulatory Visit
Admission: RE | Admit: 2024-02-15 | Discharge: 2024-02-15 | Disposition: A | Discharge status: Home / Self Care | Source: Ambulatory Visit | Attending: Radiation Oncology

## 2024-02-15 ENCOUNTER — Other Ambulatory Visit: Payer: Self-pay

## 2024-02-15 DIAGNOSIS — C61 Malignant neoplasm of prostate: Secondary | ICD-10-CM | POA: Diagnosis not present

## 2024-02-15 LAB — RAD ONC ARIA SESSION SUMMARY
Course Elapsed Days: 2
Plan Fractions Treated to Date: 3
Plan Prescribed Dose Per Fraction: 2.5 Gy
Plan Total Fractions Prescribed: 28
Plan Total Prescribed Dose: 70 Gy
Reference Point Dosage Given to Date: 7.5 Gy
Reference Point Session Dosage Given: 2.5 Gy
Session Number: 3

## 2024-02-18 ENCOUNTER — Ambulatory Visit
Admission: RE | Admit: 2024-02-18 | Discharge: 2024-02-18 | Discharge status: Home / Self Care | Source: Ambulatory Visit | Attending: Radiation Oncology

## 2024-02-18 ENCOUNTER — Other Ambulatory Visit: Payer: Self-pay

## 2024-02-18 DIAGNOSIS — C61 Malignant neoplasm of prostate: Secondary | ICD-10-CM | POA: Diagnosis not present

## 2024-02-18 LAB — RAD ONC ARIA SESSION SUMMARY
Course Elapsed Days: 5
Plan Fractions Treated to Date: 4
Plan Prescribed Dose Per Fraction: 2.5 Gy
Plan Total Fractions Prescribed: 28
Plan Total Prescribed Dose: 70 Gy
Reference Point Dosage Given to Date: 10 Gy
Reference Point Session Dosage Given: 2.5 Gy
Session Number: 4

## 2024-02-19 ENCOUNTER — Ambulatory Visit
Admission: RE | Admit: 2024-02-19 | Discharge: 2024-02-19 | Disposition: A | Discharge status: Home / Self Care | Source: Ambulatory Visit | Attending: Radiation Oncology | Admitting: Radiation Oncology

## 2024-02-19 ENCOUNTER — Other Ambulatory Visit: Payer: Self-pay

## 2024-02-19 DIAGNOSIS — C61 Malignant neoplasm of prostate: Secondary | ICD-10-CM | POA: Diagnosis not present

## 2024-02-19 LAB — RAD ONC ARIA SESSION SUMMARY
Course Elapsed Days: 6
Plan Fractions Treated to Date: 5
Plan Prescribed Dose Per Fraction: 2.5 Gy
Plan Total Fractions Prescribed: 28
Plan Total Prescribed Dose: 70 Gy
Reference Point Dosage Given to Date: 12.5 Gy
Reference Point Session Dosage Given: 2.5 Gy
Session Number: 5

## 2024-02-20 ENCOUNTER — Other Ambulatory Visit: Payer: Self-pay

## 2024-02-20 ENCOUNTER — Ambulatory Visit
Admission: RE | Admit: 2024-02-20 | Discharge: 2024-02-20 | Disposition: A | Discharge status: Home / Self Care | Source: Ambulatory Visit | Attending: Radiation Oncology

## 2024-02-20 DIAGNOSIS — C61 Malignant neoplasm of prostate: Secondary | ICD-10-CM | POA: Diagnosis not present

## 2024-02-20 LAB — RAD ONC ARIA SESSION SUMMARY
Course Elapsed Days: 7
Plan Fractions Treated to Date: 6
Plan Prescribed Dose Per Fraction: 2.5 Gy
Plan Total Fractions Prescribed: 28
Plan Total Prescribed Dose: 70 Gy
Reference Point Dosage Given to Date: 15 Gy
Reference Point Session Dosage Given: 2.5 Gy
Session Number: 6

## 2024-02-21 ENCOUNTER — Other Ambulatory Visit: Payer: Self-pay

## 2024-02-21 ENCOUNTER — Ambulatory Visit
Admission: RE | Admit: 2024-02-21 | Discharge: 2024-02-21 | Discharge status: Home / Self Care | Source: Ambulatory Visit | Attending: Radiation Oncology

## 2024-02-21 ENCOUNTER — Ambulatory Visit
Admission: RE | Admit: 2024-02-21 | Discharge: 2024-02-21 | Disposition: A | Discharge status: Home / Self Care | Source: Ambulatory Visit | Attending: Radiation Oncology

## 2024-02-21 DIAGNOSIS — C61 Malignant neoplasm of prostate: Secondary | ICD-10-CM | POA: Diagnosis not present

## 2024-02-21 LAB — RAD ONC ARIA SESSION SUMMARY
Course Elapsed Days: 8
Plan Fractions Treated to Date: 7
Plan Prescribed Dose Per Fraction: 2.5 Gy
Plan Total Fractions Prescribed: 28
Plan Total Prescribed Dose: 70 Gy
Reference Point Dosage Given to Date: 17.5 Gy
Reference Point Session Dosage Given: 2.5 Gy
Session Number: 7

## 2024-02-22 ENCOUNTER — Ambulatory Visit

## 2024-02-22 ENCOUNTER — Other Ambulatory Visit: Payer: Self-pay

## 2024-02-22 ENCOUNTER — Ambulatory Visit
Admission: RE | Admit: 2024-02-22 | Discharge: 2024-02-22 | Disposition: A | Discharge status: Home / Self Care | Source: Ambulatory Visit | Attending: Radiation Oncology | Admitting: Radiation Oncology

## 2024-02-22 DIAGNOSIS — C61 Malignant neoplasm of prostate: Secondary | ICD-10-CM | POA: Diagnosis not present

## 2024-02-22 LAB — RAD ONC ARIA SESSION SUMMARY
Course Elapsed Days: 9
Plan Fractions Treated to Date: 8
Plan Prescribed Dose Per Fraction: 2.5 Gy
Plan Total Fractions Prescribed: 28
Plan Total Prescribed Dose: 70 Gy
Reference Point Dosage Given to Date: 20 Gy
Reference Point Session Dosage Given: 2.5 Gy
Session Number: 8

## 2024-02-25 ENCOUNTER — Other Ambulatory Visit: Payer: Self-pay

## 2024-02-25 ENCOUNTER — Ambulatory Visit
Admission: RE | Admit: 2024-02-25 | Discharge: 2024-02-25 | Disposition: A | Discharge status: Home / Self Care | Source: Ambulatory Visit | Attending: Radiation Oncology | Admitting: Radiation Oncology

## 2024-02-25 DIAGNOSIS — C61 Malignant neoplasm of prostate: Secondary | ICD-10-CM | POA: Diagnosis not present

## 2024-02-25 LAB — RAD ONC ARIA SESSION SUMMARY
Course Elapsed Days: 12
Plan Fractions Treated to Date: 9
Plan Prescribed Dose Per Fraction: 2.5 Gy
Plan Total Fractions Prescribed: 28
Plan Total Prescribed Dose: 70 Gy
Reference Point Dosage Given to Date: 22.5 Gy
Reference Point Session Dosage Given: 2.5 Gy
Session Number: 9

## 2024-02-26 ENCOUNTER — Ambulatory Visit
Admission: RE | Admit: 2024-02-26 | Discharge: 2024-02-26 | Discharge status: Home / Self Care | Source: Ambulatory Visit | Attending: Radiation Oncology

## 2024-02-26 ENCOUNTER — Other Ambulatory Visit: Payer: Self-pay

## 2024-02-26 DIAGNOSIS — C61 Malignant neoplasm of prostate: Secondary | ICD-10-CM | POA: Diagnosis not present

## 2024-02-26 LAB — RAD ONC ARIA SESSION SUMMARY
Course Elapsed Days: 13
Plan Fractions Treated to Date: 10
Plan Prescribed Dose Per Fraction: 2.5 Gy
Plan Total Fractions Prescribed: 28
Plan Total Prescribed Dose: 70 Gy
Reference Point Dosage Given to Date: 25 Gy
Reference Point Session Dosage Given: 2.5 Gy
Session Number: 10

## 2024-02-27 ENCOUNTER — Ambulatory Visit

## 2024-02-28 ENCOUNTER — Ambulatory Visit
Admission: RE | Admit: 2024-02-28 | Discharge: 2024-02-28 | Disposition: A | Discharge status: Home / Self Care | Source: Ambulatory Visit | Attending: Radiation Oncology | Admitting: Radiation Oncology

## 2024-02-28 ENCOUNTER — Other Ambulatory Visit: Payer: Self-pay

## 2024-02-28 ENCOUNTER — Ambulatory Visit

## 2024-02-28 DIAGNOSIS — C61 Malignant neoplasm of prostate: Secondary | ICD-10-CM | POA: Diagnosis not present

## 2024-02-28 LAB — RAD ONC ARIA SESSION SUMMARY
Course Elapsed Days: 15
Plan Fractions Treated to Date: 11
Plan Prescribed Dose Per Fraction: 2.5 Gy
Plan Total Fractions Prescribed: 28
Plan Total Prescribed Dose: 70 Gy
Reference Point Dosage Given to Date: 27.5 Gy
Reference Point Session Dosage Given: 2.5 Gy
Session Number: 11

## 2024-02-29 ENCOUNTER — Other Ambulatory Visit: Payer: Self-pay

## 2024-02-29 ENCOUNTER — Ambulatory Visit
Admission: RE | Admit: 2024-02-29 | Discharge: 2024-02-29 | Disposition: A | Discharge status: Home / Self Care | Source: Ambulatory Visit | Attending: Radiation Oncology | Admitting: Radiation Oncology

## 2024-02-29 DIAGNOSIS — C61 Malignant neoplasm of prostate: Secondary | ICD-10-CM | POA: Diagnosis not present

## 2024-02-29 LAB — RAD ONC ARIA SESSION SUMMARY
Course Elapsed Days: 16
Plan Fractions Treated to Date: 12
Plan Prescribed Dose Per Fraction: 2.5 Gy
Plan Total Fractions Prescribed: 28
Plan Total Prescribed Dose: 70 Gy
Reference Point Dosage Given to Date: 30 Gy
Reference Point Session Dosage Given: 2.5 Gy
Session Number: 12

## 2024-03-03 ENCOUNTER — Other Ambulatory Visit: Payer: Self-pay

## 2024-03-03 ENCOUNTER — Ambulatory Visit
Admission: RE | Admit: 2024-03-03 | Discharge: 2024-03-03 | Disposition: A | Discharge status: Home / Self Care | Source: Ambulatory Visit | Attending: Radiation Oncology | Admitting: Radiation Oncology

## 2024-03-03 DIAGNOSIS — C61 Malignant neoplasm of prostate: Secondary | ICD-10-CM | POA: Diagnosis not present

## 2024-03-03 LAB — RAD ONC ARIA SESSION SUMMARY
Course Elapsed Days: 19
Plan Fractions Treated to Date: 13
Plan Prescribed Dose Per Fraction: 2.5 Gy
Plan Total Fractions Prescribed: 28
Plan Total Prescribed Dose: 70 Gy
Reference Point Dosage Given to Date: 32.5 Gy
Reference Point Session Dosage Given: 2.5 Gy
Session Number: 13

## 2024-03-04 ENCOUNTER — Ambulatory Visit
Admission: RE | Admit: 2024-03-04 | Discharge: 2024-03-04 | Disposition: A | Discharge status: Home / Self Care | Source: Ambulatory Visit | Attending: Radiation Oncology | Admitting: Radiation Oncology

## 2024-03-04 ENCOUNTER — Other Ambulatory Visit: Payer: Self-pay

## 2024-03-04 DIAGNOSIS — C61 Malignant neoplasm of prostate: Secondary | ICD-10-CM | POA: Diagnosis not present

## 2024-03-04 LAB — RAD ONC ARIA SESSION SUMMARY
Course Elapsed Days: 20
Plan Fractions Treated to Date: 14
Plan Prescribed Dose Per Fraction: 2.5 Gy
Plan Total Fractions Prescribed: 28
Plan Total Prescribed Dose: 70 Gy
Reference Point Dosage Given to Date: 35 Gy
Reference Point Session Dosage Given: 2.5 Gy
Session Number: 14

## 2024-03-05 ENCOUNTER — Other Ambulatory Visit: Payer: Self-pay

## 2024-03-05 ENCOUNTER — Ambulatory Visit
Admission: RE | Admit: 2024-03-05 | Discharge: 2024-03-05 | Disposition: A | Discharge status: Home / Self Care | Source: Ambulatory Visit | Attending: Radiation Oncology

## 2024-03-05 DIAGNOSIS — C61 Malignant neoplasm of prostate: Secondary | ICD-10-CM | POA: Diagnosis not present

## 2024-03-05 LAB — RAD ONC ARIA SESSION SUMMARY
Course Elapsed Days: 21
Plan Fractions Treated to Date: 15
Plan Prescribed Dose Per Fraction: 2.5 Gy
Plan Total Fractions Prescribed: 28
Plan Total Prescribed Dose: 70 Gy
Reference Point Dosage Given to Date: 37.5 Gy
Reference Point Session Dosage Given: 2.5 Gy
Session Number: 15

## 2024-03-06 ENCOUNTER — Other Ambulatory Visit: Payer: Self-pay

## 2024-03-06 ENCOUNTER — Ambulatory Visit
Admission: RE | Admit: 2024-03-06 | Discharge: 2024-03-06 | Disposition: A | Discharge status: Home / Self Care | Source: Ambulatory Visit | Attending: Radiation Oncology

## 2024-03-06 DIAGNOSIS — C61 Malignant neoplasm of prostate: Secondary | ICD-10-CM | POA: Diagnosis not present

## 2024-03-06 LAB — RAD ONC ARIA SESSION SUMMARY
Course Elapsed Days: 22
Plan Fractions Treated to Date: 16
Plan Prescribed Dose Per Fraction: 2.5 Gy
Plan Total Fractions Prescribed: 28
Plan Total Prescribed Dose: 70 Gy
Reference Point Dosage Given to Date: 40 Gy
Reference Point Session Dosage Given: 2.5 Gy
Session Number: 16

## 2024-03-07 ENCOUNTER — Ambulatory Visit
Admission: RE | Admit: 2024-03-07 | Discharge: 2024-03-07 | Disposition: A | Discharge status: Home / Self Care | Source: Ambulatory Visit | Attending: Radiation Oncology | Admitting: Radiation Oncology

## 2024-03-07 ENCOUNTER — Other Ambulatory Visit: Payer: Self-pay

## 2024-03-07 DIAGNOSIS — C61 Malignant neoplasm of prostate: Secondary | ICD-10-CM | POA: Insufficient documentation

## 2024-03-07 LAB — RAD ONC ARIA SESSION SUMMARY
Course Elapsed Days: 23
Plan Fractions Treated to Date: 17
Plan Prescribed Dose Per Fraction: 2.5 Gy
Plan Total Fractions Prescribed: 28
Plan Total Prescribed Dose: 70 Gy
Reference Point Dosage Given to Date: 42.5 Gy
Reference Point Session Dosage Given: 2.5 Gy
Session Number: 17

## 2024-03-10 ENCOUNTER — Other Ambulatory Visit: Payer: Self-pay

## 2024-03-10 ENCOUNTER — Ambulatory Visit
Admission: RE | Admit: 2024-03-10 | Discharge: 2024-03-10 | Disposition: A | Discharge status: Home / Self Care | Source: Ambulatory Visit | Attending: Radiation Oncology

## 2024-03-10 DIAGNOSIS — C61 Malignant neoplasm of prostate: Secondary | ICD-10-CM | POA: Diagnosis not present

## 2024-03-10 LAB — RAD ONC ARIA SESSION SUMMARY
Course Elapsed Days: 26
Plan Fractions Treated to Date: 18
Plan Prescribed Dose Per Fraction: 2.5 Gy
Plan Total Fractions Prescribed: 28
Plan Total Prescribed Dose: 70 Gy
Reference Point Dosage Given to Date: 45 Gy
Reference Point Session Dosage Given: 2.5 Gy
Session Number: 18

## 2024-03-11 ENCOUNTER — Ambulatory Visit
Admission: RE | Admit: 2024-03-11 | Discharge: 2024-03-11 | Disposition: A | Discharge status: Home / Self Care | Source: Ambulatory Visit | Attending: Radiation Oncology | Admitting: Radiation Oncology

## 2024-03-11 ENCOUNTER — Other Ambulatory Visit: Payer: Self-pay

## 2024-03-11 DIAGNOSIS — C61 Malignant neoplasm of prostate: Secondary | ICD-10-CM | POA: Diagnosis not present

## 2024-03-11 LAB — RAD ONC ARIA SESSION SUMMARY
Course Elapsed Days: 27
Plan Fractions Treated to Date: 19
Plan Prescribed Dose Per Fraction: 2.5 Gy
Plan Total Fractions Prescribed: 28
Plan Total Prescribed Dose: 70 Gy
Reference Point Dosage Given to Date: 47.5 Gy
Reference Point Session Dosage Given: 2.5 Gy
Session Number: 19

## 2024-03-12 ENCOUNTER — Other Ambulatory Visit: Payer: Self-pay

## 2024-03-12 ENCOUNTER — Ambulatory Visit
Admission: RE | Admit: 2024-03-12 | Discharge: 2024-03-12 | Disposition: A | Discharge status: Home / Self Care | Source: Ambulatory Visit | Attending: Radiation Oncology | Admitting: Radiation Oncology

## 2024-03-12 DIAGNOSIS — C61 Malignant neoplasm of prostate: Secondary | ICD-10-CM | POA: Diagnosis not present

## 2024-03-12 LAB — RAD ONC ARIA SESSION SUMMARY
Course Elapsed Days: 28
Plan Fractions Treated to Date: 20
Plan Prescribed Dose Per Fraction: 2.5 Gy
Plan Total Fractions Prescribed: 28
Plan Total Prescribed Dose: 70 Gy
Reference Point Dosage Given to Date: 50 Gy
Reference Point Session Dosage Given: 2.5 Gy
Session Number: 20

## 2024-03-13 ENCOUNTER — Ambulatory Visit
Admission: RE | Admit: 2024-03-13 | Discharge: 2024-03-13 | Disposition: A | Discharge status: Home / Self Care | Source: Ambulatory Visit | Attending: Radiation Oncology | Admitting: Radiation Oncology

## 2024-03-13 ENCOUNTER — Other Ambulatory Visit: Payer: Self-pay

## 2024-03-13 DIAGNOSIS — C61 Malignant neoplasm of prostate: Secondary | ICD-10-CM | POA: Diagnosis not present

## 2024-03-13 LAB — RAD ONC ARIA SESSION SUMMARY
Course Elapsed Days: 29
Plan Fractions Treated to Date: 21
Plan Prescribed Dose Per Fraction: 2.5 Gy
Plan Total Fractions Prescribed: 28
Plan Total Prescribed Dose: 70 Gy
Reference Point Dosage Given to Date: 52.5 Gy
Reference Point Session Dosage Given: 2.5 Gy
Session Number: 21

## 2024-03-14 ENCOUNTER — Other Ambulatory Visit: Payer: Self-pay

## 2024-03-14 ENCOUNTER — Ambulatory Visit
Admission: RE | Admit: 2024-03-14 | Discharge: 2024-03-14 | Disposition: A | Discharge status: Home / Self Care | Source: Ambulatory Visit | Attending: Radiation Oncology | Admitting: Radiation Oncology

## 2024-03-14 ENCOUNTER — Ambulatory Visit

## 2024-03-14 DIAGNOSIS — C61 Malignant neoplasm of prostate: Secondary | ICD-10-CM | POA: Diagnosis not present

## 2024-03-14 LAB — RAD ONC ARIA SESSION SUMMARY
Course Elapsed Days: 30
Plan Fractions Treated to Date: 22
Plan Prescribed Dose Per Fraction: 2.5 Gy
Plan Total Fractions Prescribed: 28
Plan Total Prescribed Dose: 70 Gy
Reference Point Dosage Given to Date: 55 Gy
Reference Point Session Dosage Given: 2.5 Gy
Session Number: 22

## 2024-03-17 ENCOUNTER — Other Ambulatory Visit: Payer: Self-pay

## 2024-03-17 ENCOUNTER — Ambulatory Visit
Admission: RE | Admit: 2024-03-17 | Discharge: 2024-03-17 | Disposition: A | Discharge status: Home / Self Care | Source: Ambulatory Visit | Attending: Radiation Oncology

## 2024-03-17 DIAGNOSIS — C61 Malignant neoplasm of prostate: Secondary | ICD-10-CM | POA: Diagnosis not present

## 2024-03-17 LAB — RAD ONC ARIA SESSION SUMMARY
Course Elapsed Days: 33
Plan Fractions Treated to Date: 23
Plan Prescribed Dose Per Fraction: 2.5 Gy
Plan Total Fractions Prescribed: 28
Plan Total Prescribed Dose: 70 Gy
Reference Point Dosage Given to Date: 57.5 Gy
Reference Point Session Dosage Given: 2.5 Gy
Session Number: 23

## 2024-03-18 ENCOUNTER — Ambulatory Visit
Admission: RE | Admit: 2024-03-18 | Discharge: 2024-03-18 | Disposition: A | Discharge status: Home / Self Care | Source: Ambulatory Visit | Attending: Radiation Oncology

## 2024-03-18 ENCOUNTER — Other Ambulatory Visit: Payer: Self-pay

## 2024-03-18 DIAGNOSIS — C61 Malignant neoplasm of prostate: Secondary | ICD-10-CM | POA: Diagnosis not present

## 2024-03-18 LAB — RAD ONC ARIA SESSION SUMMARY
Course Elapsed Days: 34
Plan Fractions Treated to Date: 24
Plan Prescribed Dose Per Fraction: 2.5 Gy
Plan Total Fractions Prescribed: 28
Plan Total Prescribed Dose: 70 Gy
Reference Point Dosage Given to Date: 60 Gy
Reference Point Session Dosage Given: 2.5 Gy
Session Number: 24

## 2024-03-19 ENCOUNTER — Other Ambulatory Visit: Payer: Self-pay

## 2024-03-19 ENCOUNTER — Ambulatory Visit
Admission: RE | Admit: 2024-03-19 | Discharge: 2024-03-19 | Disposition: A | Discharge status: Home / Self Care | Source: Ambulatory Visit | Attending: Radiation Oncology | Admitting: Radiation Oncology

## 2024-03-19 DIAGNOSIS — C61 Malignant neoplasm of prostate: Secondary | ICD-10-CM | POA: Diagnosis not present

## 2024-03-19 LAB — RAD ONC ARIA SESSION SUMMARY
Course Elapsed Days: 35
Plan Fractions Treated to Date: 25
Plan Prescribed Dose Per Fraction: 2.5 Gy
Plan Total Fractions Prescribed: 28
Plan Total Prescribed Dose: 70 Gy
Reference Point Dosage Given to Date: 62.5 Gy
Reference Point Session Dosage Given: 2.5 Gy
Session Number: 25

## 2024-03-20 ENCOUNTER — Other Ambulatory Visit: Payer: Self-pay

## 2024-03-20 ENCOUNTER — Ambulatory Visit
Admission: RE | Admit: 2024-03-20 | Discharge: 2024-03-20 | Disposition: A | Discharge status: Home / Self Care | Source: Ambulatory Visit | Attending: Radiation Oncology | Admitting: Radiation Oncology

## 2024-03-20 DIAGNOSIS — C61 Malignant neoplasm of prostate: Secondary | ICD-10-CM | POA: Diagnosis not present

## 2024-03-20 LAB — RAD ONC ARIA SESSION SUMMARY
Course Elapsed Days: 36
Plan Fractions Treated to Date: 26
Plan Prescribed Dose Per Fraction: 2.5 Gy
Plan Total Fractions Prescribed: 28
Plan Total Prescribed Dose: 70 Gy
Reference Point Dosage Given to Date: 65 Gy
Reference Point Session Dosage Given: 2.5 Gy
Session Number: 26

## 2024-03-21 ENCOUNTER — Other Ambulatory Visit: Payer: Self-pay

## 2024-03-21 ENCOUNTER — Ambulatory Visit
Admission: RE | Admit: 2024-03-21 | Discharge: 2024-03-21 | Disposition: A | Discharge status: Home / Self Care | Source: Ambulatory Visit | Attending: Radiation Oncology | Admitting: Radiation Oncology

## 2024-03-21 ENCOUNTER — Ambulatory Visit

## 2024-03-21 DIAGNOSIS — C61 Malignant neoplasm of prostate: Secondary | ICD-10-CM | POA: Diagnosis not present

## 2024-03-21 LAB — RAD ONC ARIA SESSION SUMMARY
Course Elapsed Days: 37
Plan Fractions Treated to Date: 27
Plan Prescribed Dose Per Fraction: 2.5 Gy
Plan Total Fractions Prescribed: 28
Plan Total Prescribed Dose: 70 Gy
Reference Point Dosage Given to Date: 67.5 Gy
Reference Point Session Dosage Given: 2.5 Gy
Session Number: 27

## 2024-03-24 ENCOUNTER — Ambulatory Visit
Admission: RE | Admit: 2024-03-24 | Discharge: 2024-03-24 | Disposition: A | Discharge status: Home / Self Care | Source: Ambulatory Visit | Attending: Radiation Oncology | Admitting: Radiation Oncology

## 2024-03-24 ENCOUNTER — Other Ambulatory Visit: Payer: Self-pay

## 2024-03-24 DIAGNOSIS — C61 Malignant neoplasm of prostate: Secondary | ICD-10-CM

## 2024-03-24 LAB — RAD ONC ARIA SESSION SUMMARY
Course Elapsed Days: 40
Plan Fractions Treated to Date: 28
Plan Prescribed Dose Per Fraction: 2.5 Gy
Plan Total Fractions Prescribed: 28
Plan Total Prescribed Dose: 70 Gy
Reference Point Dosage Given to Date: 70 Gy
Reference Point Session Dosage Given: 2.5 Gy
Session Number: 28

## 2024-03-25 NOTE — Radiation Completion Notes (Addendum)
  Radiation Oncology         [phone]) [phone] ________________________________  Name: Michael Randall. MRN: [id number]  Date: 03/24/2024  DOB: 05/07/1956  Referring Physician: DONNICE SIAD, M.D. Date of Service: 2024-03-25 Radiation Oncologist: Adina Barge, M.D. [facility]     RADIATION ONCOLOGY END OF TREATMENT NOTE     Diagnosis: 68 y.o. gentleman with Stage T1c adenocarcinoma of the prostate with Gleason score of 3+4, and PSA of 4.84.   Intent: Curative     ==========DELIVERED PLANS==========  First Treatment Date: 2024-02-13 Last Treatment Date: 2024-03-24   Plan Name: Prostate Site: Prostate Technique: IMRT Mode: Photon Dose Per Fraction: 2.5 Gy Prescribed Dose (Delivered / Prescribed): 70 Gy / 70 Gy Prescribed Fxs (Delivered / Prescribed): 28 / 28     ==========ON TREATMENT VISIT DATES========== 2024-02-15, 2024-02-21, 2024-02-29, 2024-03-07, 2024-03-13, 2024-03-21    See weekly On Treatment Notes in Epic for details in the Media tab (listed as Progress notes on the On Treatment Visit Dates listed above).  He tolerated the daily radiation treatments relatively well with occasional diarrhea and increased LUTS.  He also reported modest fatigue.   The patient will receive a call in about one month from the radiation oncology department. He will continue follow up with his urologist, Dr. SIAD, as well.  ------------------------------------------------   DONNICE Barge, MD [facility]  Radiation Oncology Direct Dial: [phone]  Fax: [phone] [email or web address]  Skype  LinkedIn

## 2024-04-03 NOTE — Progress Notes (Signed)
 Patient was a RadOnc Consult on 10/09/23 for his stage T1c adenocarcinoma of the prostate with Gleason score of 3+4, and PSA of 4.84.  Patient proceed with treatment recommendations of 5.5 weeks of radiation and had his final radiation treatment on 03/24/24.   Patient is scheduled for a post treatment nurse call on 04/22/24 and has his first post treatment PSA on 04/22/24 at [facility].

## 2024-04-04 ENCOUNTER — Other Ambulatory Visit: Payer: Self-pay | Admitting: Urology

## 2024-04-04 ENCOUNTER — Telehealth: Payer: Self-pay | Admitting: Radiation Oncology

## 2024-04-04 DIAGNOSIS — C61 Malignant neoplasm of prostate: Secondary | ICD-10-CM

## 2024-04-04 NOTE — Telephone Encounter (Signed)
 8/29 Outgoing Referral forward to [facility]

## 2024-04-21 NOTE — Progress Notes (Signed)
  Radiation Oncology         [phone] ________________________________  Name: Michael Randall. MRN: [id number]  Date of Service: 04/22/2024  DOB: July 27, 1956  Post Treatment Telephone Note  Diagnosis:  68 y.o. gentleman with Stage T1c adenocarcinoma of the prostate with Gleason score of 3+4, and PSA of 4.84.   Pre Treatment IPSS Score: 15  The patient {WAS/WAS NOT:[phone]::was not} available for call today.   Symptoms of fatigue {ACTIONS; HAVE/HAVE NOT:19434} improved since completing therapy.  Symptoms of bladder changes {ACTIONS; HAVE/HAVE WNU:80565} improved since completing therapy. Current symptoms include ***, and medications for bladder symptoms include ***.  Symptoms of bowel changes {ACTIONS; HAVE/HAVE NOT:19434} improved since completing therapy. Current symptoms include ***, and medications for bowel symptoms include ***.   Post Treatment IPSS Score:  IPSS Questionnaire (AUA-7): Over the past month.   1)  How often have you had a sensation of not emptying your bladder completely after you finish urinating?  {Rating:19227}  2)  How often have you had to urinate again less than two hours after you finished urinating? {Rating:19227}  3)  How often have you found you stopped and started again several times when you urinated?  {Rating:19227}  4) How difficult have you found it to postpone urination?  {Rating:19227}  5) How often have you had a weak urinary stream?  {Rating:19227}  6) How often have you had to push or strain to begin urination?  {Rating:19227}  7) How many times did you most typically get up to urinate from the time you went to bed until the time you got up in the morning?  {Rating:19228}  Total score:  {0-35:19561} Which indicates {Mild/Moderate/Severe:20260} symptoms  0-7 mildly symptomatic   8-19 moderately symptomatic   20-35 severely symptomatic   Patient (has a scheduled follow up visit with his urologist, Dr. Donnice Siad, on ***/ does not  currently have any scheduled follow up with his urologist to his knowledge so he was advised to call [facility] to schedule his post-treatment follow up with Dr. Donnice Siad) for ongoing surveillance. He was counseled that PSA levels will be drawn in the urology office, and was reassured that additional time is expected to improve bowel and bladder symptoms. He was encouraged to call back with concerns or questions regarding radiation.

## 2024-04-22 ENCOUNTER — Ambulatory Visit
Admission: RE | Admit: 2024-04-22 | Discharge: 2024-04-22 | Disposition: A | Discharge status: Home / Self Care | Source: Ambulatory Visit | Attending: Internal Medicine | Admitting: Internal Medicine

## 2024-04-22 DIAGNOSIS — C61 Malignant neoplasm of prostate: Secondary | ICD-10-CM | POA: Insufficient documentation

## 2024-07-22 ENCOUNTER — Encounter: Payer: Self-pay | Admitting: *Deleted

## 2024-09-25 ENCOUNTER — Inpatient Hospital Stay: Admitting: *Deleted
